# Patient Record
Sex: Male | Born: 1955 | Smoking: Former smoker
Health system: Southern US, Academic
[De-identification: ages and names within clinical notes are randomized; demographics above are authoritative.]

---

## 2018-01-08 ENCOUNTER — Encounter (INDEPENDENT_AMBULATORY_CARE_PROVIDER_SITE_OTHER): Payer: Self-pay | Admitting: Nephrology

## 2018-01-08 NOTE — Progress Notes (Signed)
New pt referral.  Voice message left with appt date/time & location.  Directions provided.  A.George, RN

## 2018-02-22 ENCOUNTER — Ambulatory Visit (INDEPENDENT_AMBULATORY_CARE_PROVIDER_SITE_OTHER): Payer: Self-pay | Admitting: Nephrology

## 2018-02-22 ENCOUNTER — Encounter (INDEPENDENT_AMBULATORY_CARE_PROVIDER_SITE_OTHER): Payer: Self-pay

## 2018-02-22 DIAGNOSIS — N183 Chronic kidney disease, stage 3 unspecified (CMS HCC): Secondary | ICD-10-CM

## 2018-02-22 DIAGNOSIS — E1121 Type 2 diabetes mellitus with diabetic nephropathy: Secondary | ICD-10-CM

## 2018-02-22 DIAGNOSIS — R030 Elevated blood-pressure reading, without diagnosis of hypertension: Secondary | ICD-10-CM

## 2018-02-22 DIAGNOSIS — E119 Type 2 diabetes mellitus without complications: Secondary | ICD-10-CM | POA: Insufficient documentation

## 2018-02-22 NOTE — Progress Notes (Signed)
ID:  62 year old man.  His primary care provider is Kevin SaaAshton Humphrey, NP-C    Chief complaint:  Chronic kidney disease    HPI:  The patient is a 62 year old man who presents for the evaluation of chronic kidney disease.  Review of records sent by the patient's primary care provider included office note dated 01/04/2018 and 12/31/2017.  Laboratory testing from 01/01/2018 showed a hemoglobin of 13.9 hemoglobin A1c 7.4%, sodium 140, potassium 5.2, bicarb 22, BUN 38, creatinine 1.7, albumin 4.8, calcium 9.8, microalbumin urine random 357.    Patient states that he has been a diabetic as long as he can remember.  It does not sound like he has had very good follow-up secondary to his job.  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  This has made it difficult for him to establish care.  A nephrologist in [place] told him Metformin ruined his kidneys.  He has been on several different medications for his diabetes since then.  He does not bring his list today and could not recall what he is taking currently.  He denies a history of retinopathy.  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  He was initially on gabapentin but that cause memory loss.  On Lyrica and Cymbalta  his neuropathy resolved.  He denied hypertension.  He states his home pressure on Friday was 118 systolic.  He denies a history of coronary artery disease or peripheral vascular disease.  He has not had any frequent urinary tract infection or kidney stones.  He denies a history of NSAID use.    Past medical history:  1. Diabetes mellitus  2.  Hypertension  3. Hyperlipidemia    Past surgical history:  1. Repair of left femur fracture    Current Outpatient Medications   Medication Sig   . losartan (COZAAR) 25 mg Oral Tablet Take 25 mg by mouth Once a day   . semaglutide (OZEMPIC) 1 mg/dose (2 mg/1.5 mL) Subcutaneous Pen Injector 1 mg by Subcutaneous route Every 7 days   . UNKNOWN MEDICATION (UNKNOWN MEDICATION)     Patient did not bring  medication list today and could not recall the medications he is currently taking.  He will bring this at to his next visit.    Social history.  Patient is a former smoker.  Patient works as a Conservation officer, [employer] worker.  He is frequently asked to move for work.    Family history:  Positive for leukemia in his mother.  No history of CKD.    Review of systems:  + voiding complaints including frequency and nocturia. Otherwise a 10+ review of systems negative except per HPI.    Physical exam:  BP 140/90   Pulse 74   Wt 103.6 kg (228 lb 6.4 oz)   General: Very pleasant, in no distress.   Eyes: No scleral icterus. EOMI  ENT: Oral mucosa moist, no lesions, no fetor.   Neck:  Supple; no JVD  Heart: Regular rate and rhythm, no murmurs, rubs or gallops.  Lungs: Clear to auscultation bilaterally with good respiratory effort. No wheezes or crackles  Abdomen: Bowel sounds present, soft, nontender.   Extremities: No pitting lower extremity edema. Warm to touch.  Neurologic: No asterixis, no tremor.      Assessment:  1. 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  Patient has been seen by Nephrology in the past.  Reports his GFR has been 30-40.  2.  Diabetes mellitus.  Hemoglobin A1c recently at 7.4%.  History of neuropathy and positive micro albuminuria.  3. Elevated blood pressure.  Patient denies a history of hypertension, however evidence of elevated blood pressure here today as well as at previous primary care visit.    Recommendations:  1. I reviewed with the patient the findings of the testing, discussed his previous nephrology evaluations and discussed risk factors for chronic kidney disease and chronic kidney disease progression.  Unfortunately, given the nature of the patient's occupation, he has had difficulty maintaining continuity of care for his chronic diseases, placing him at risk for progression.  2.  Patient was agreeable to undergoing a little bit more workup.  He denied ever having a  kidney ultrasound.  Will assess size and parenchyma of the kidneys.  We will also look at the bladder given voiding complaints.  3.Patient related that he is going to have a set of blood work for his primary care provider in the coming weeks.  I did give him a slip to assess all CKD issues.  We will also plan on quantitating urine protein.  4.  I scheduled the patient back for 4 weeks to review the plan of care of his chronic kidney disease.  He will notify us if he has to leave the area prior to our 4 week follow-up.  We will try to gather the information that we have obtained so he can take it with him to his next home.    Maryjane HurterBethany S Alaijah Gibler, MD  02/22/2018, 19:59

## 2018-02-22 NOTE — Progress Notes (Signed)
[facility] on Wednesday, August 28th @ 3:00.  Pt aware of appt date/time & location.  Order faxed.  A.George, RN

## 2018-03-22 ENCOUNTER — Encounter (INDEPENDENT_AMBULATORY_CARE_PROVIDER_SITE_OTHER): Payer: Self-pay

## 2022-02-02 IMAGING — CR XR HIP 2 OR 3 VW LEFT
1 series · 2 of 2 positions shown · non-contrast
Comparison: none

no trauma, pain in left hip
FINAL REPORT:
HISTORY: Left hip pain.
Left hip.

[AP · right · 2 of 2 slices shown]
[im 1/2]
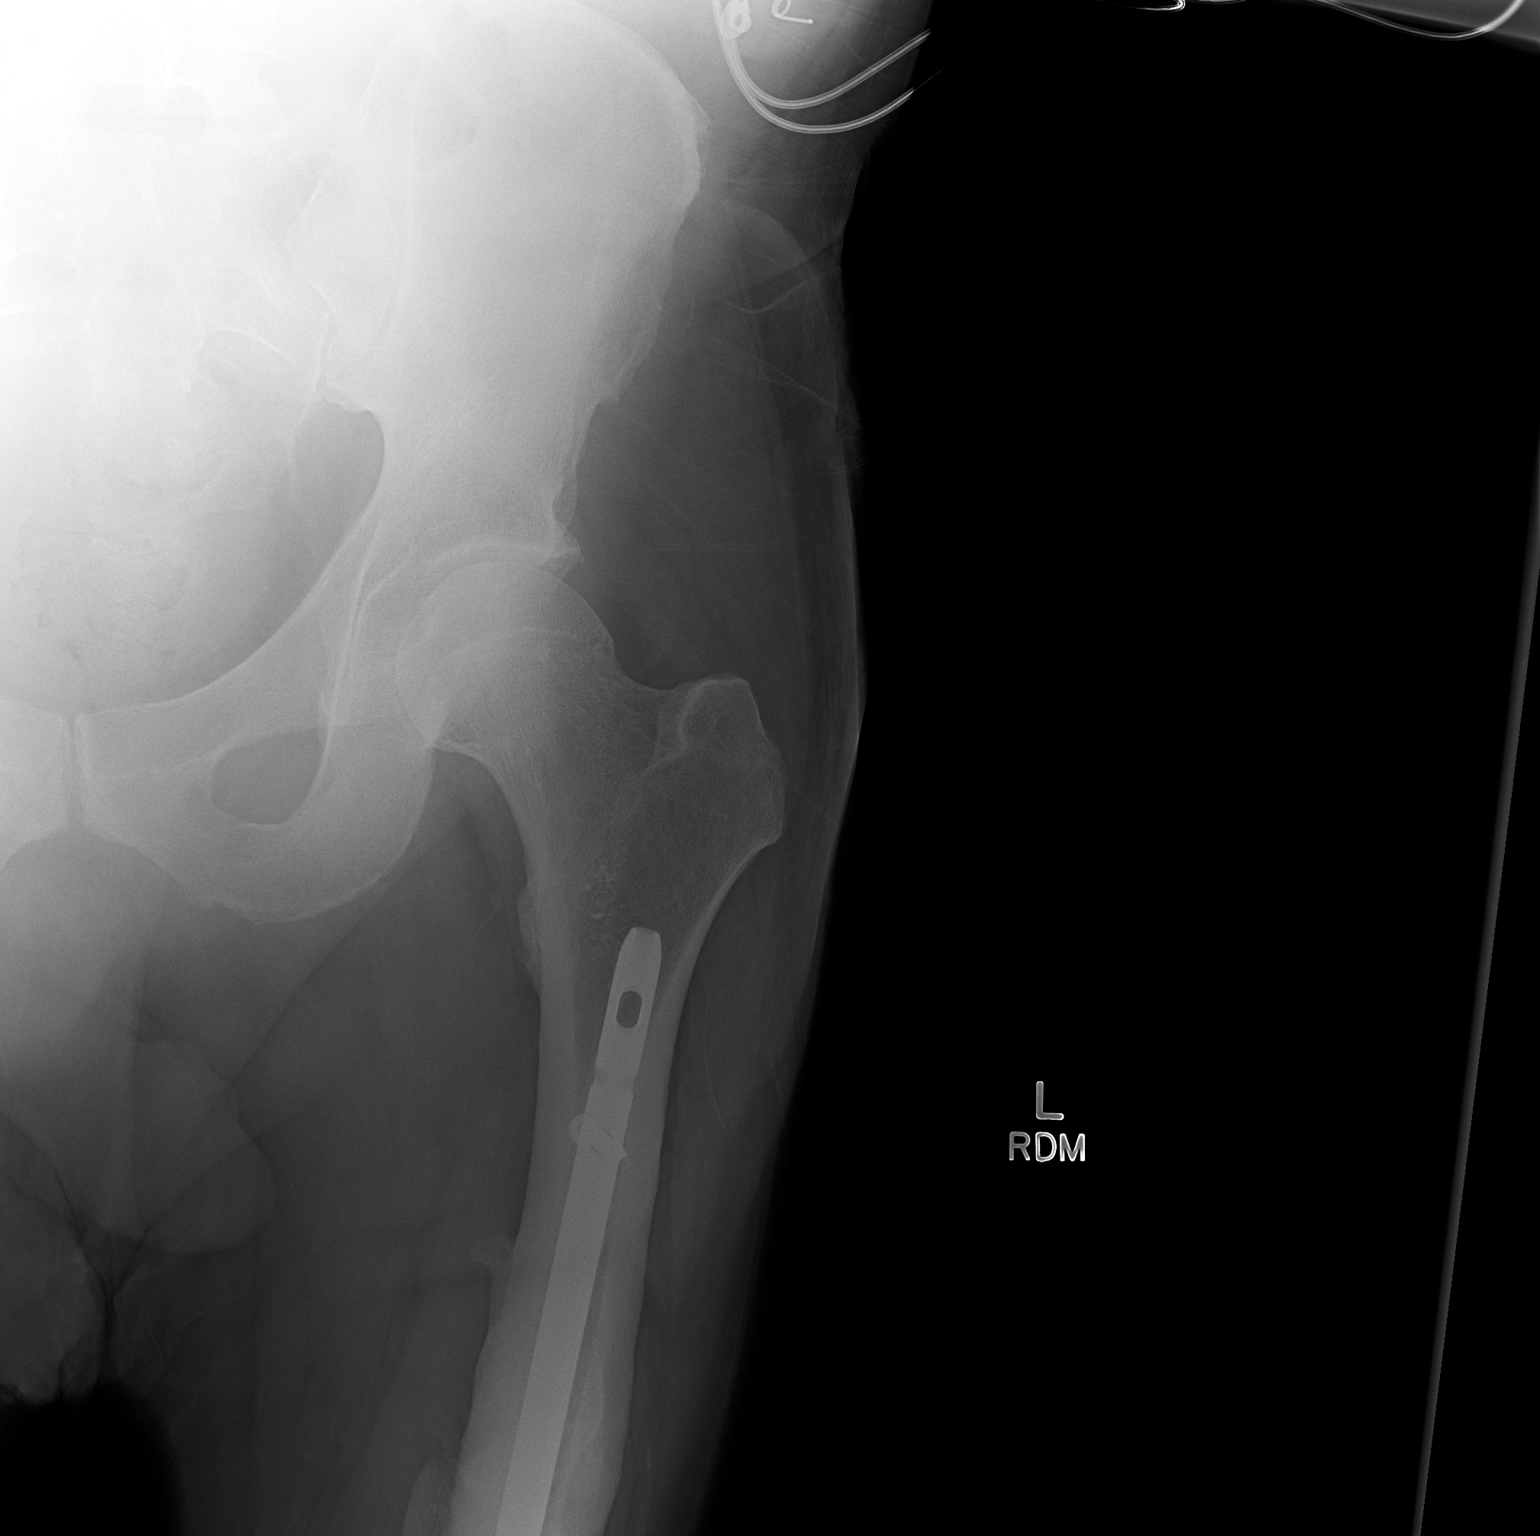
[im 2/2]
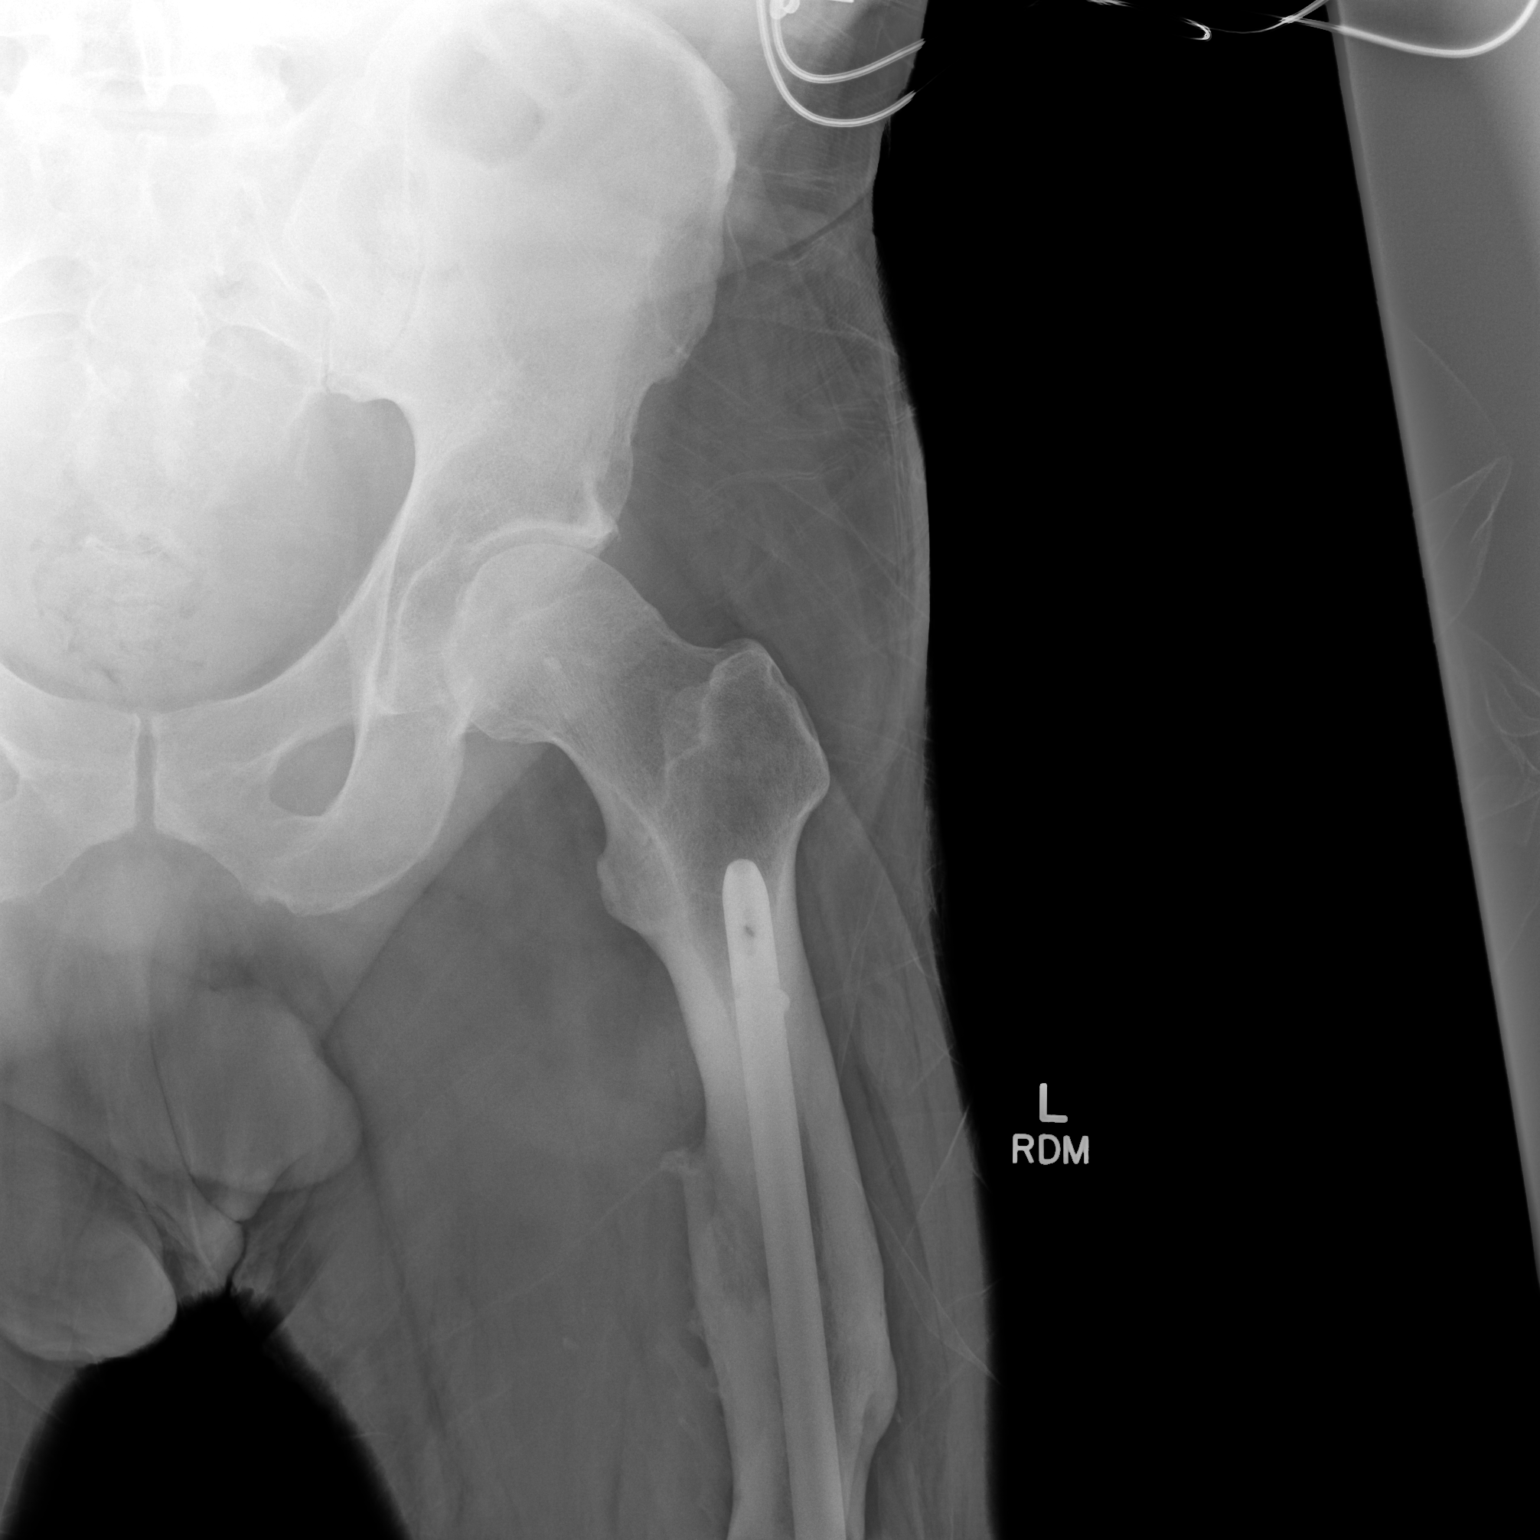

[2 of 2 positions shown; findings below may reference images not displayed]

FINDINGS: 2 views provided. Previous IM rod fixation for healed proximal left femoral shaft fracture. The hip is well located. No fracture or dislocation. Mineralization is preserved.
IMPRESSION: 1. Previous healed proximal left femoral shaft fracture with intramedullary rod fixation.
2. No bony abnormality left hip.

## 2022-02-05 IMAGING — US US LOW EXT VEINS BILAT
1 series · 14 of 16 positions shown · non-contrast
Comparison: None

ADDENDUM:
Verbally communicated to Dr. Deven on the phone by physician representative on behalf of Dr. Klever at [DATE] on February 05, 2022.
FINAL REPORT:
INDICATION: Lower extremity swelling and pain

[Series 1: us low ext veins bilat · 14 of 54 slices shown]
[im 1/54]
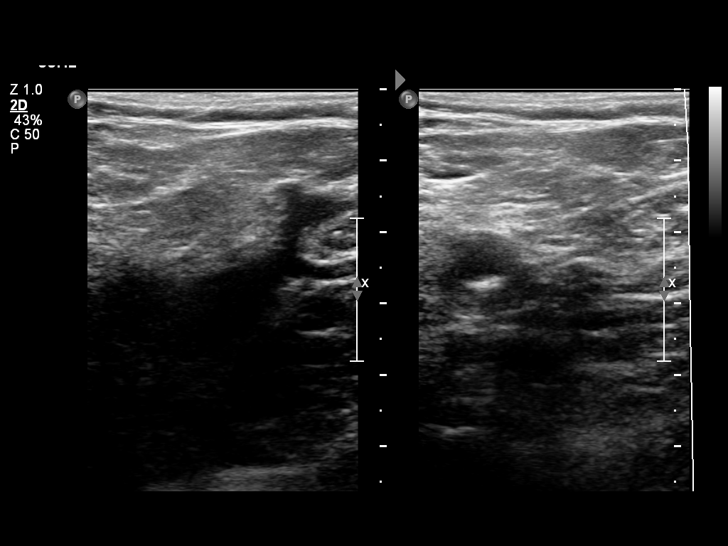
[im 4/54]
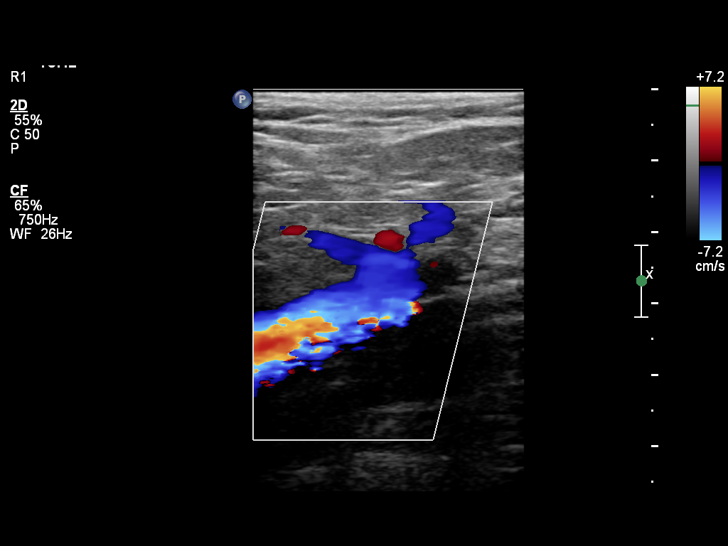
[im 8/54]
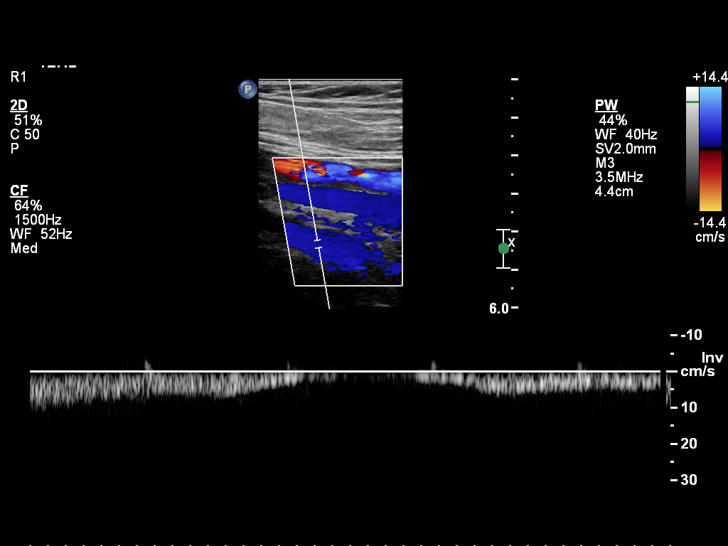
[im 15/54]
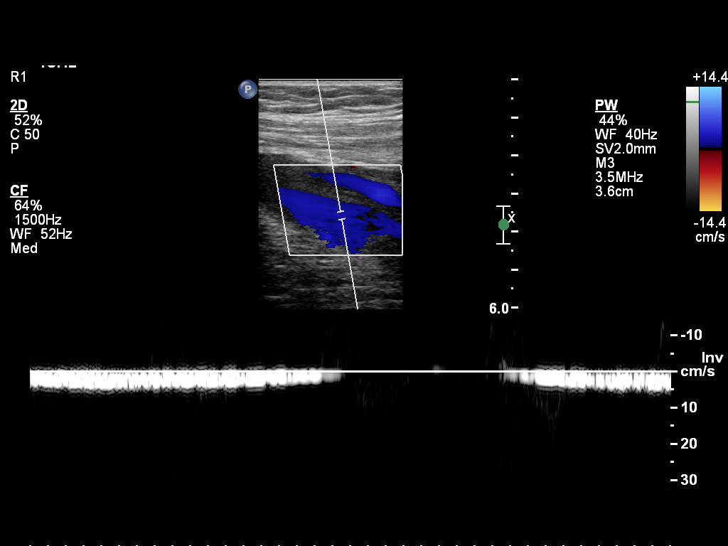
[im 18/54]
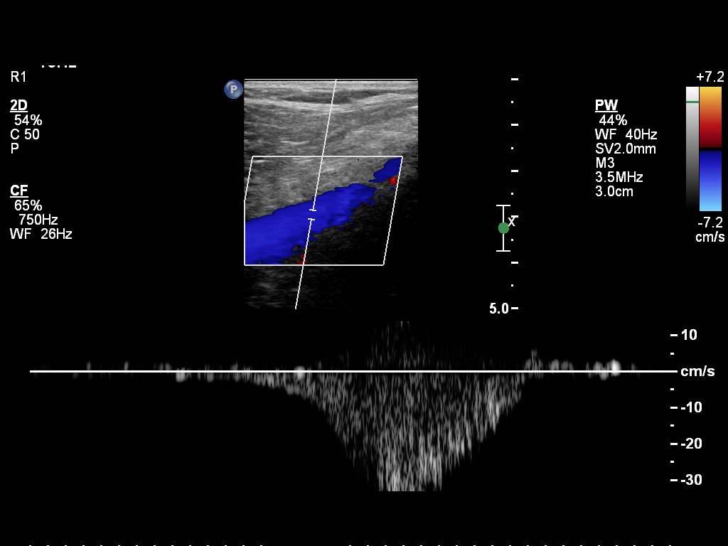
[im 22/54]
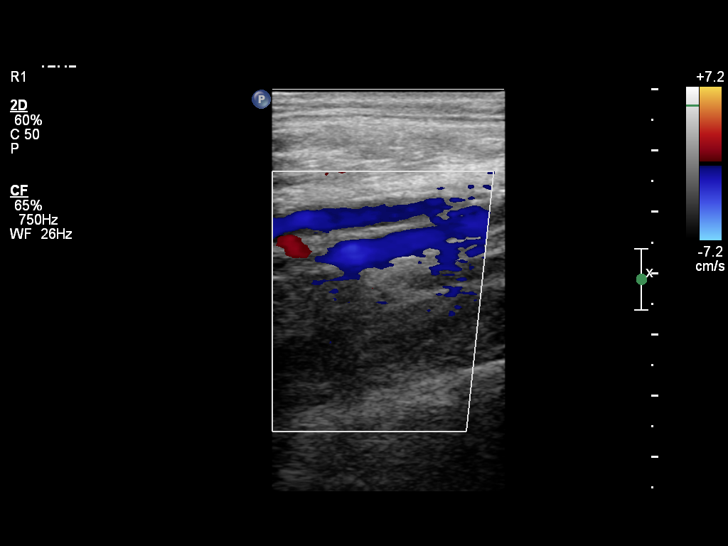
[im 25/54]
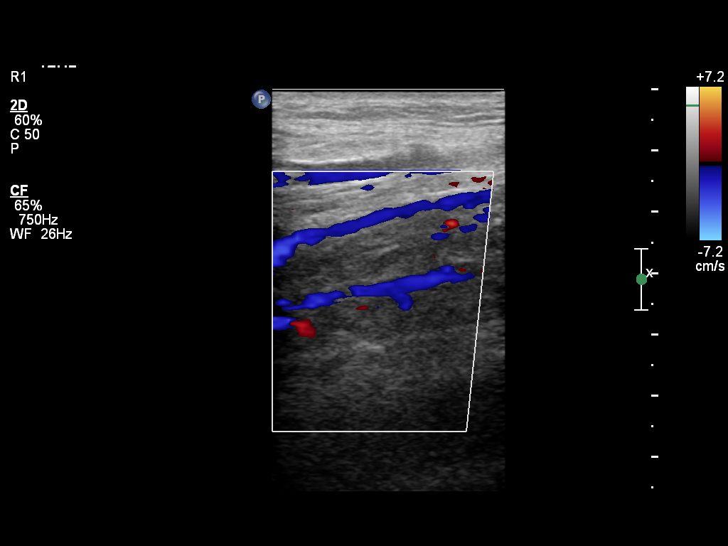
[im 29/54]
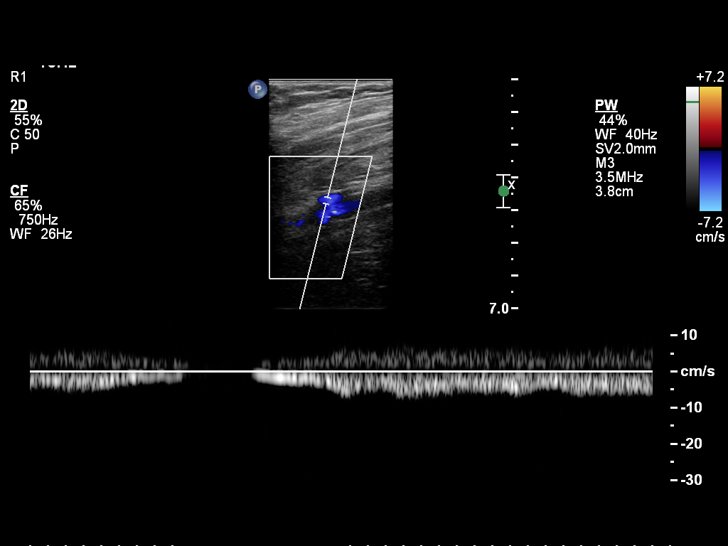
[im 32/54]
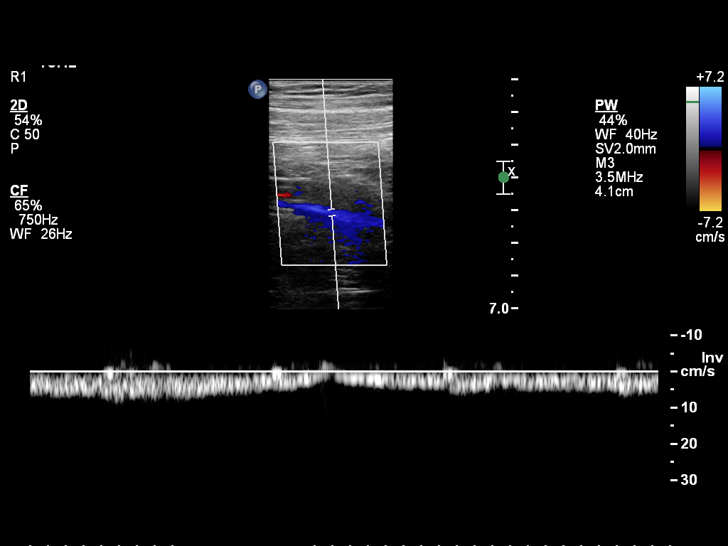
[im 36/54]
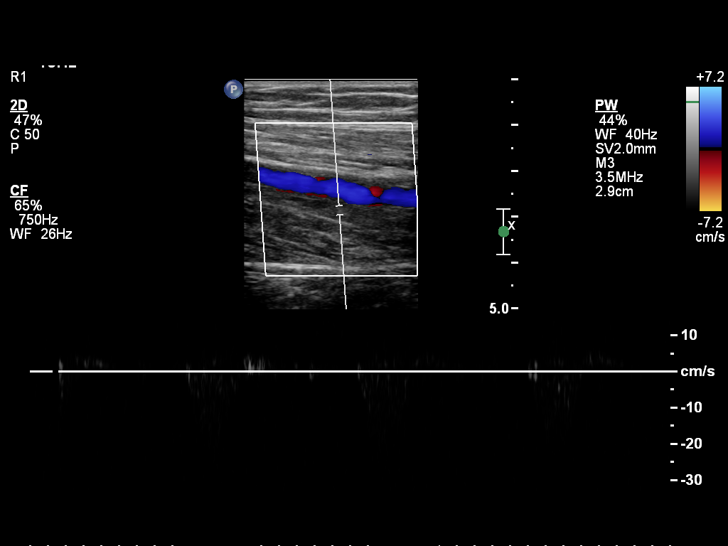
[im 43/54]
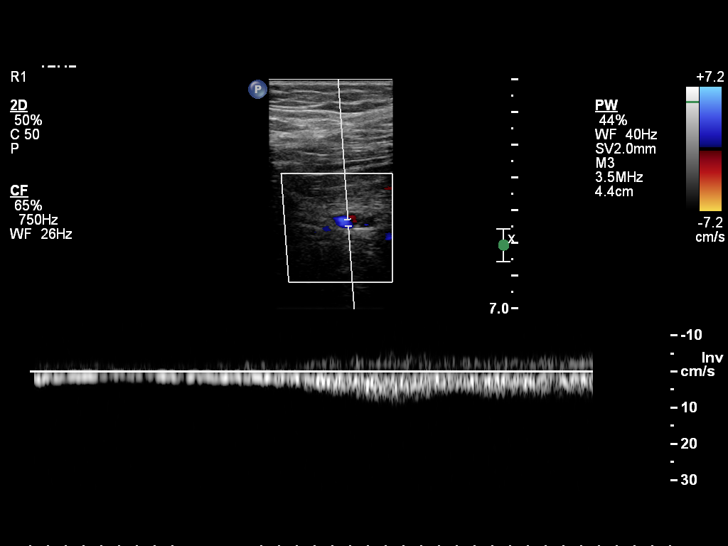
[im 46/54]
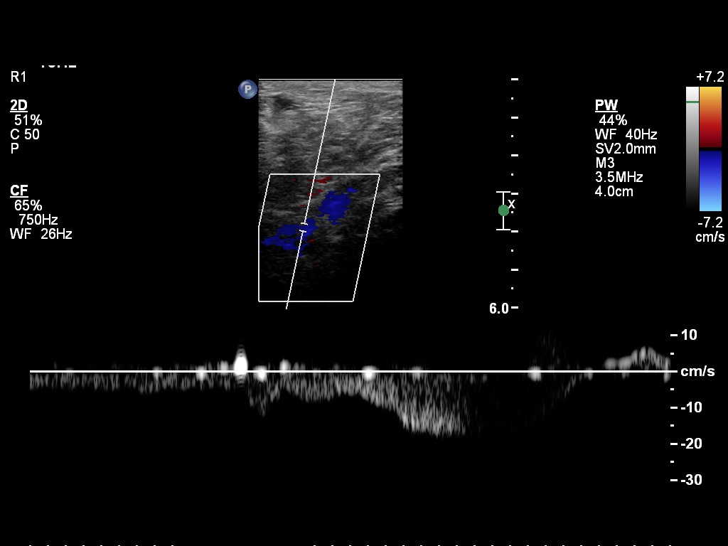
[im 50/54]
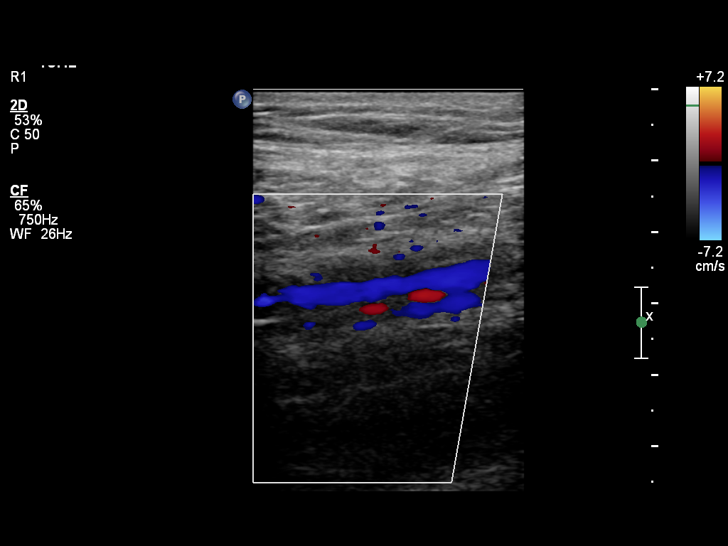
[im 54/54]
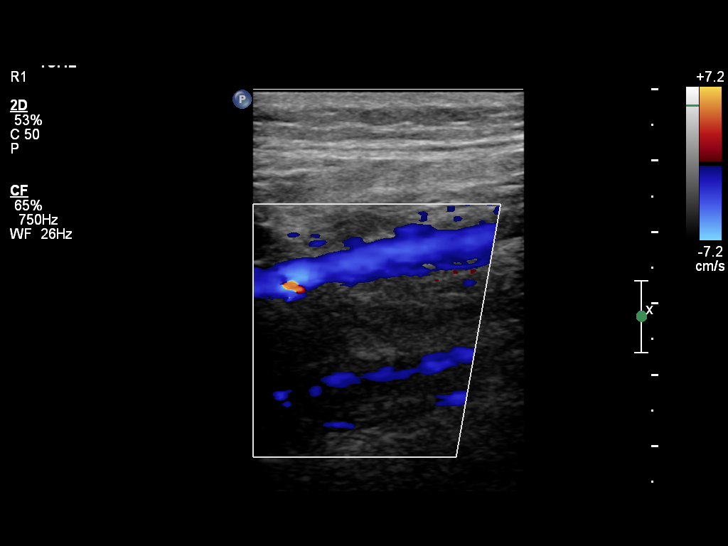

[14 of 16 positions shown; findings below may reference images not displayed]

FINDINGS: Real-time, color-flow, and duplex imaging of the deep veins of bilateral lower extremity was performed. Specifically, imaging of the common femoral, sapheno-femoral junction, profunda femoral, femoral, and popliteal veins was performed.
Left lower extremity: There is thrombus in the proximal, mid, and distal portions of the left femoral vein. Popliteal, posterior tibial, peroneal veins are compressible.
Right lower extremity: All the deep veins, as well as the sapheno-femoral junction, were compressible with normal color blood flow and normal phasicity of flow at rest with normal response to augmentation.
IMPRESSION: Thrombosis of the proximal, mid, and distal portions of the femoral vein in the left lower extremity , compatible with deep venous thrombosis.

## 2022-03-10 IMAGING — MR MRI BRAIN WITHOUT CONTRAST
10 of 12 series · 37 of 48 positions shown · non-contrast
Comparison: CT of the head 03/10/2022.

FINAL REPORT:
MRI of the brain without contrast material
INDICATION: Mental status change. Stroke follow-up. Abnormal CT of the head.
TECHNIQUE: Noncontrast multiplanar multisequence imaging of the brain was performed.

[Series 2: survey_mpr_sag · oblique · 1.6mm · 1.60mm/px · 1 of 5 slices shown]
[im 1/5]
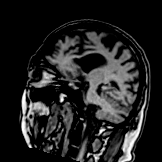

[Series 3: survey_mpr_cor · coronal · 1.6mm · 1.60mm/px · 1 of 3 slices shown]
[im 1/3]
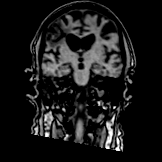

[Series 4: survey_mpr_(person_name) · axial · 1.6mm · 1.60mm/px · 1 of 3 slices shown]
[im 1/3]
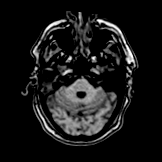

[Series 5: T1 · sagittal · 5.0mm · 0.75mm/px · 4 of 25 slices shown (1 of 2)]
[im 1/25]
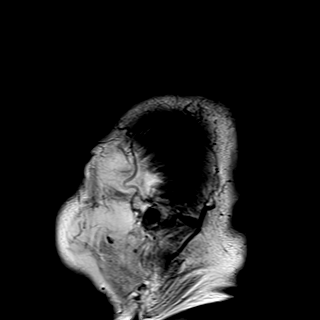
[im 9/25]
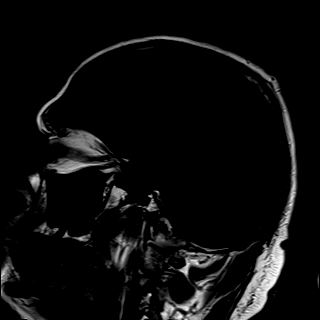
[im 17/25]
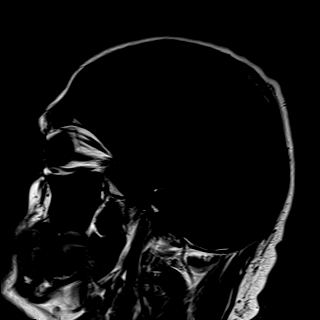
[im 25/25]
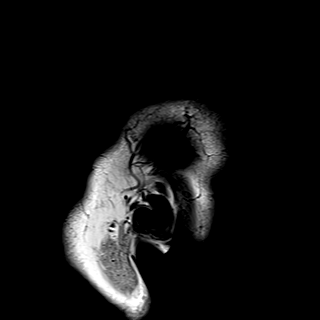

[Series 6: ax dwi_tracew · axial · 5.0mm · 0.60mm/px · z∈[-74,+37]mm · 4 of 27 slices shown]
[im 1/27]
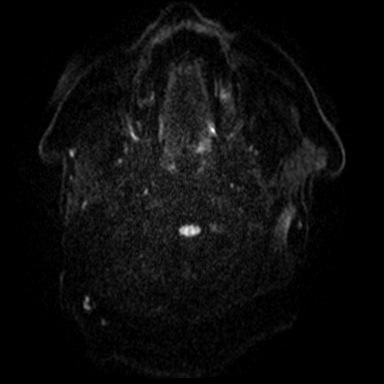
[im 7/27]
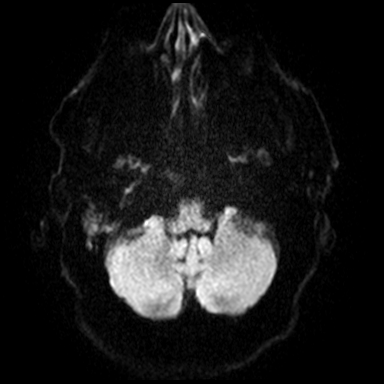
[im 14/27]
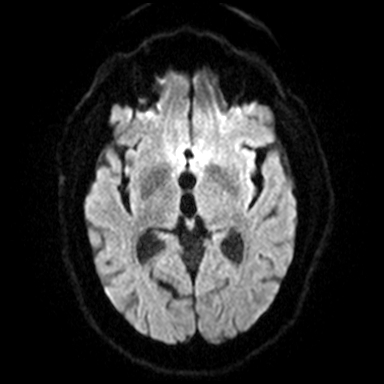
[im 20/27]
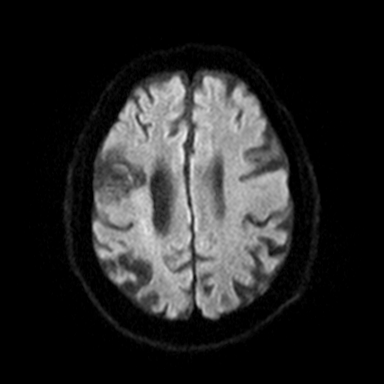

[Series 9: FLAIR · axial · 5.0mm · 0.72mm/px · z∈[-71,+81]mm · 5 of 27 slices shown]
[im 1/27]
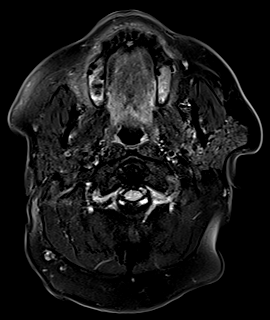
[im 7/27]
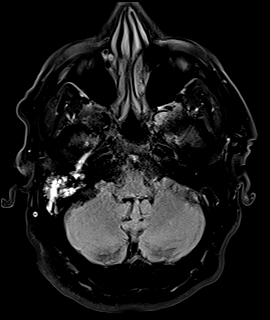
[im 14/27]
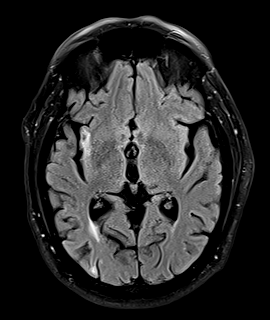
[im 20/27]
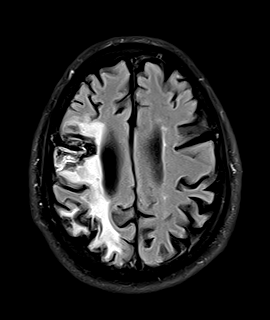
[im 27/27]
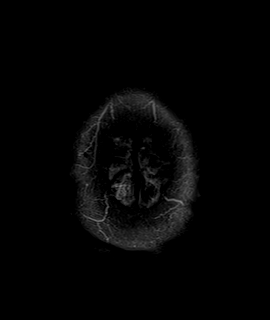

[Series 10: T2 fat-sat · axial · 5.0mm · 0.51mm/px · z∈[-71,+81]mm · 5 of 27 slices shown (1 of 2)]
[im 1/27]
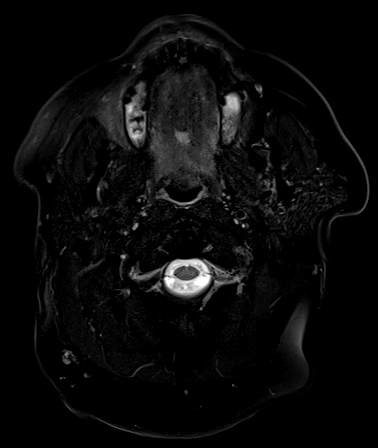
[im 7/27]
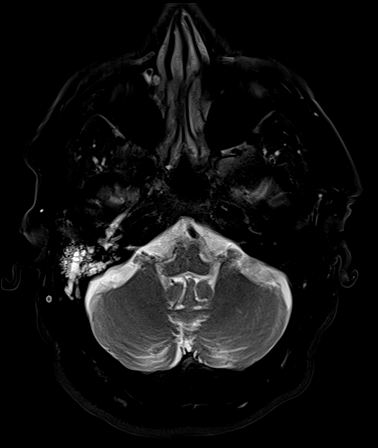
[im 14/27]
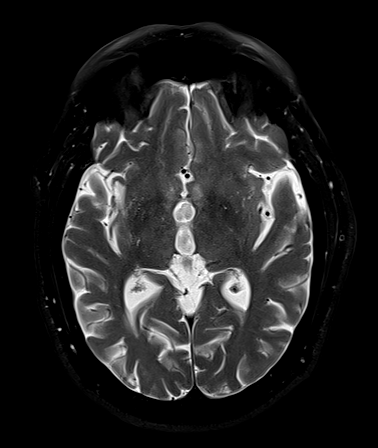
[im 20/27]
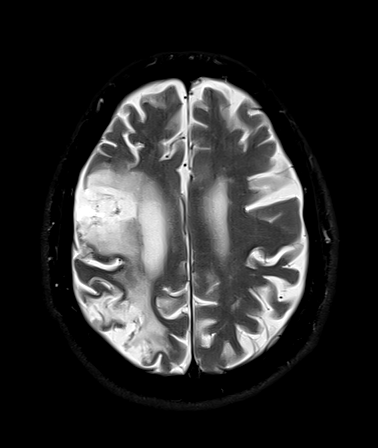
[im 27/27]
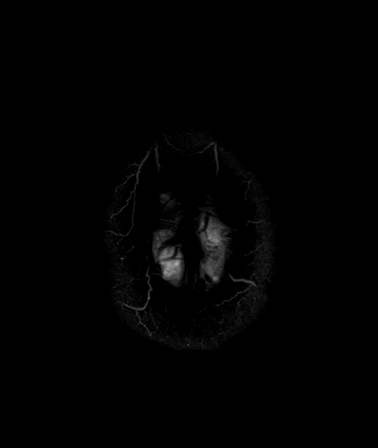

[Series 11: GRE · axial · 5.0mm · 0.45mm/px · z∈[-71,+81]mm · 5 of 27 slices shown]
[im 1/27]
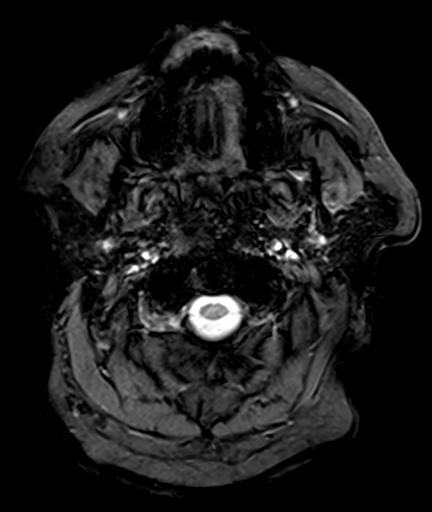
[im 7/27]
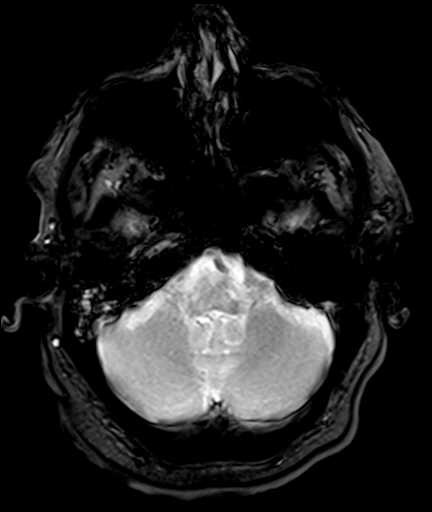
[im 14/27]
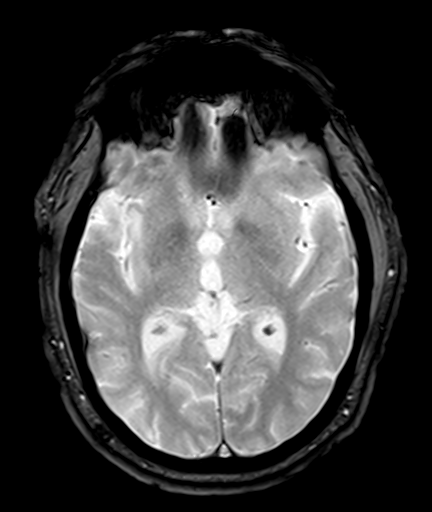
[im 20/27]
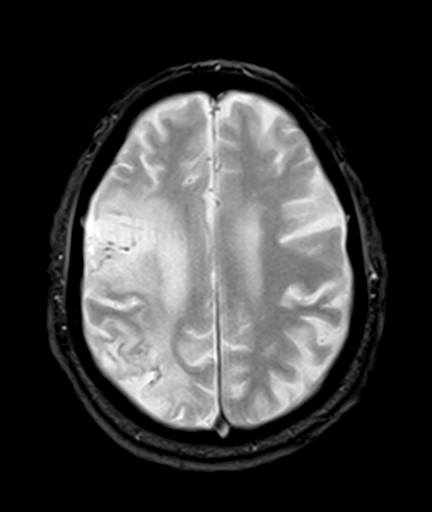
[im 27/27]
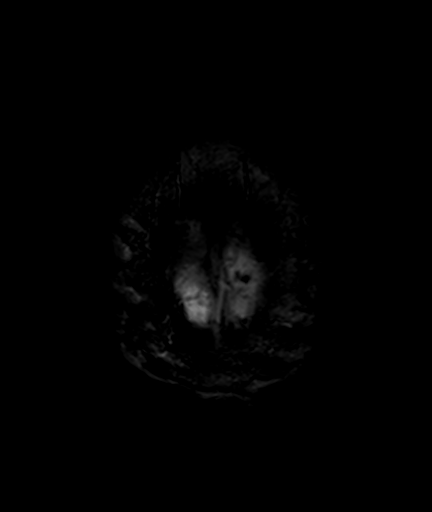

[Series 12: T1 · axial · 5.0mm · 0.72mm/px · z∈[-71,+81]mm · 5 of 27 slices shown (2 of 2)]
[im 1/27]
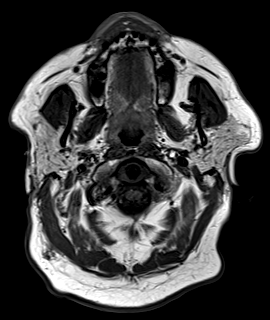
[im 7/27]
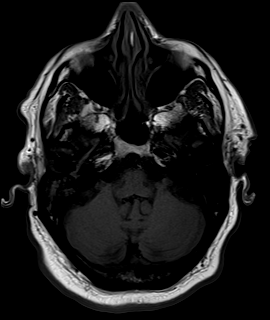
[im 14/27]
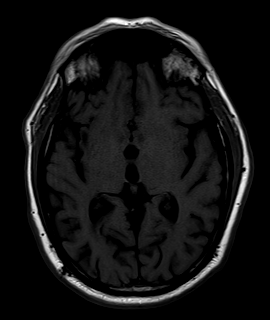
[im 20/27]
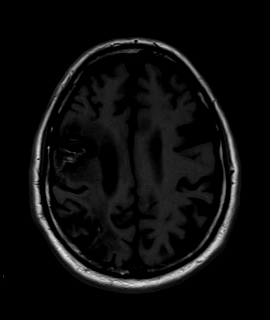
[im 27/27]
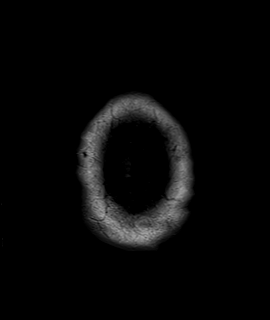

[Series 13: T2 fat-sat · coronal · 2.0mm · 0.56mm/px · 6 of 35 slices shown (2 of 2)]
[im 1/35]
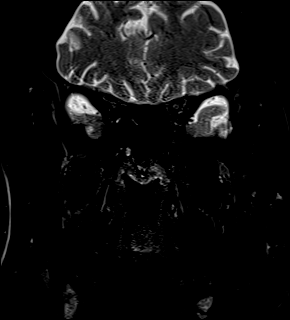
[im 7/35]
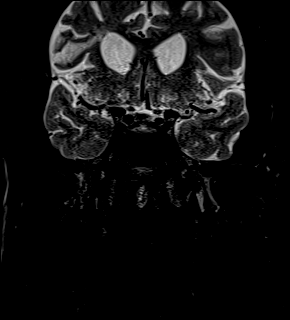
[im 14/35]
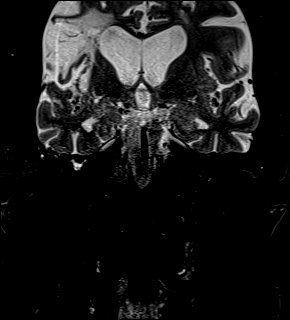
[im 21/35]
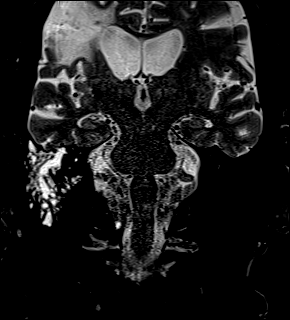
[im 28/35]
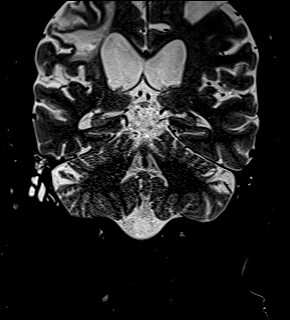
[im 35/35]
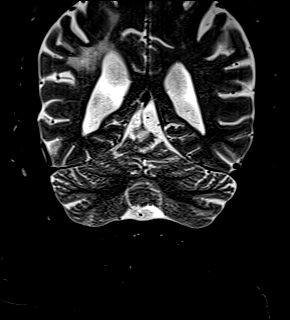

[37 of 48 positions shown; findings below may reference images not displayed]

FINDINGS: Along the high posterior lateral right frontal lobe, there is a transcortical infarction measuring 2.3 x 2.8 cm, with developing cystic encephalomalacia, and gyriform intrinsic T1 hyperintensity. This is most in keeping with an infarction in the late subacute to chronic phase, with laminar necrosis an petechial hemorrhage. Giving the laminar necrosis, no significant restricted diffusion is noted.
There is additional extension of the infarction into the anterior insula/subinsular region, without restricted diffusion. This infarction involves the anterior/superior aspects of the right MCA distribution, and possibly a small portion of the posterior watershed distribution.
There is an additional region of transcortical infarction with encephalomalacia and gliosis involving the high right parietal lobe, measuring up to 3.1 x 5.0 cm. There is no associated restricted diffusion. This is most in keeping with an infarction, which appears chronic in nature. This may involve a portion of the posterior watershed distribution and a portion of the posterior division right MCA distribution.
No focus of restricted diffusion to suggest an acute or early subacute infarction.
There are normal flow voids in the visible larger intracranial vessels.
There is no hydrocephalus.
There is mild chronic microangiopathic change in the pons. Small chronic cortical based infarction along the high left frontal lobe (series 9 images 23 and 24) involving the anterior watershed distribution.
There is moderate generalized cerebral volume loss, with commensurate prominence of the ventricular system. Normal flow voids are identified in the visible larger intracranial vessels.
The visible orbits, pituitary gland, and upper cervical spine are unremarkable.
There is extensive fluid throughout the mastoid air cells on the right. No discrete nasopharyngeal mass is noted. There is also a small amount of fluid within the right eustachian tube. No suspicious marrow signal abnormality.
IMPRESSION: 1. Late subacute to chronic infarction involving the right frontal lobe, extending into the anterior insula along a portion of the right MCA distribution or there is mild associated laminar necrosis.
2. Chronic appearing infarction along the right parietal lobe, which may involve a portion of the posterior watershed distribution as well as a portion of the MCA distribution.
3. Small chronic infarction along the high left frontal lobe involving the anterior watershed distribution.
4. Extensive fluid in the mastoid air cells on right and right eustachian tube. Correlate clinically for mastoiditis.

## 2022-03-10 IMAGING — CT CT HEAD WITHOUT CONTRAST
2 of 3 series · 15 of 40 positions shown, 18 images · non-contrast
Comparison: None available

Confusion for 3 days; no fall
Hx stroke; no cancer; no sx
FINAL REPORT:
INDICATION: Mental status change, unknown cause Pain
All CT scans at this facility use dose modulation and/or weight based dosing when appropriate to reduce radiation dose to as low as reasonably achievable.
Exam: CT of the head without IV Contrast

[Series 2: head stnd · axial · 0.46mm/px · z∈[-24,+129]mm · 12 of 36 slices shown, 15 images]
[im 3/36  brain]
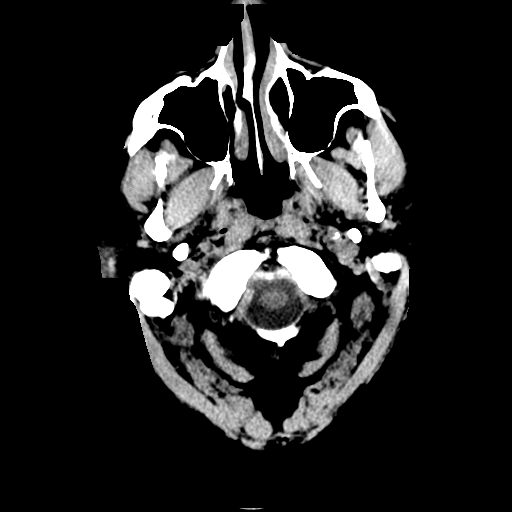
[im 3/36  bone]
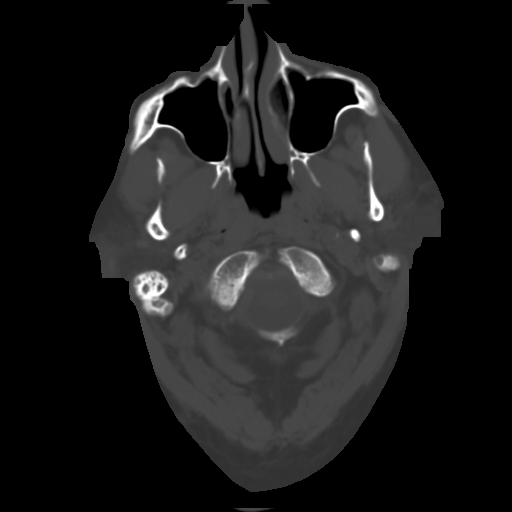
[im 5/36  brain]
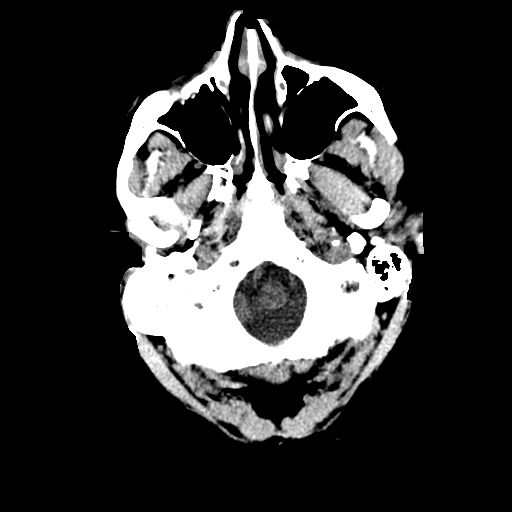
[im 8/36  brain]
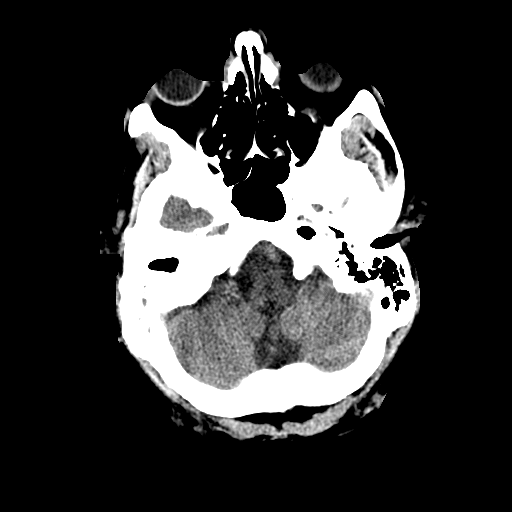
[im 11/36  brain]
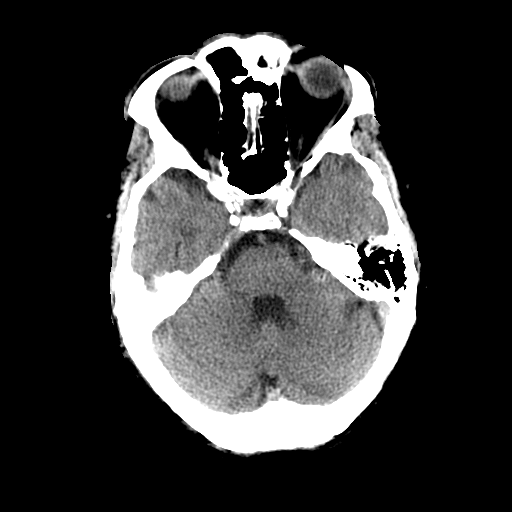
[im 14/36  brain]
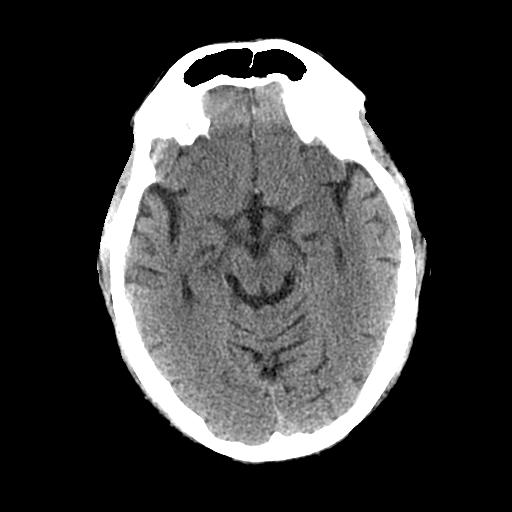
[im 14/36  bone]
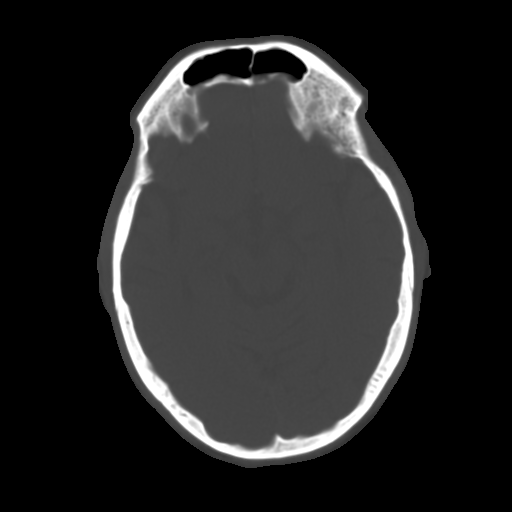
[im 16/36  brain]
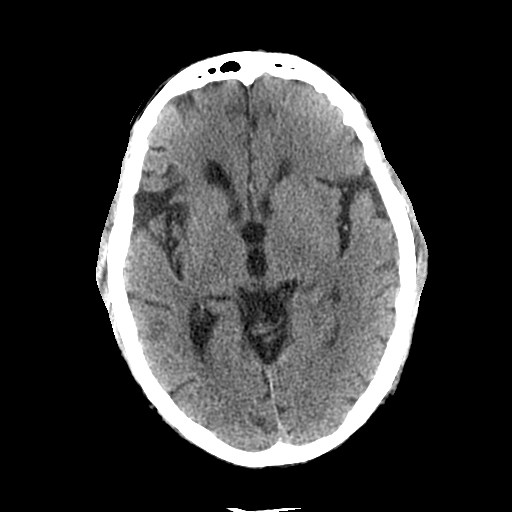
[im 20/36  brain]
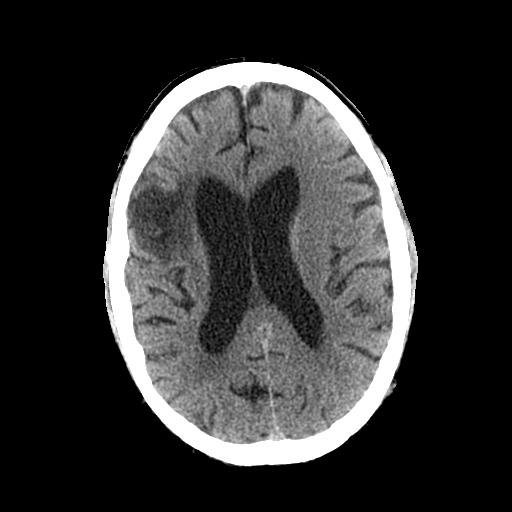
[im 22/36  brain]
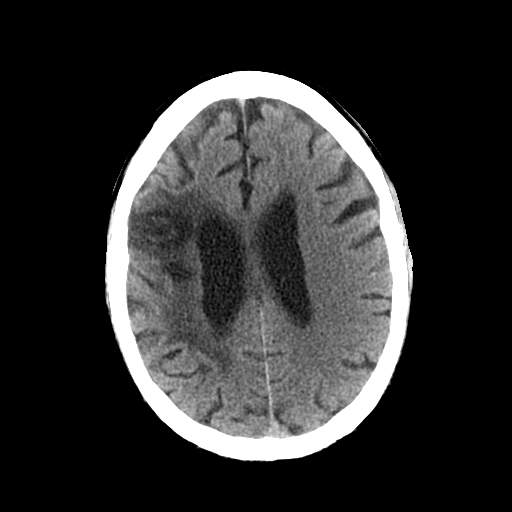
[im 25/36  brain]
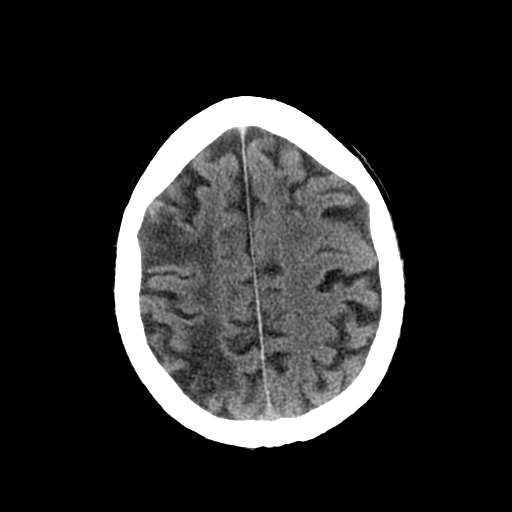
[im 25/36  bone]
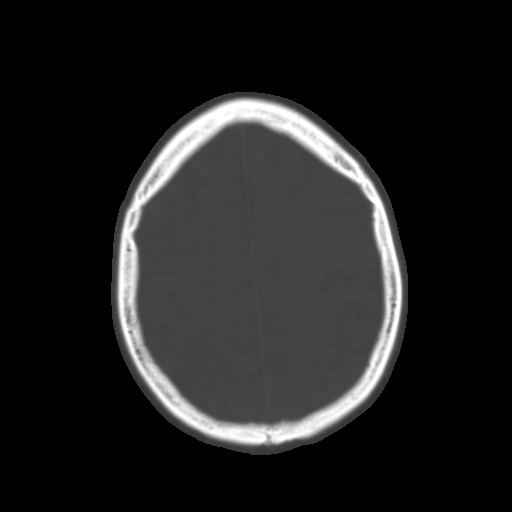
[im 28/36  brain]
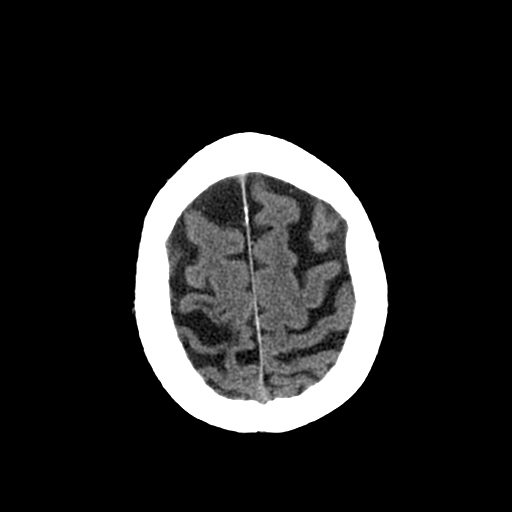
[im 31/36  brain]
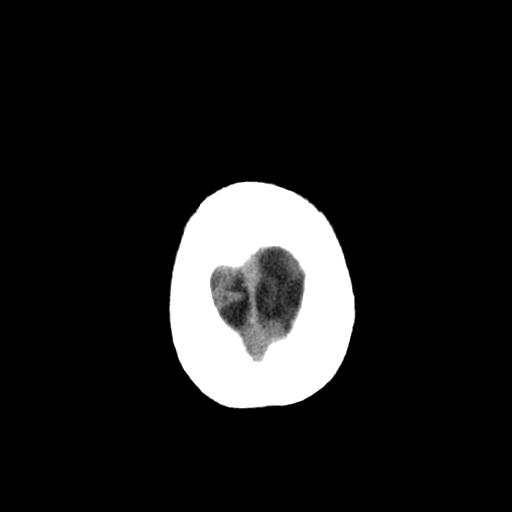
[im 33/36  brain]
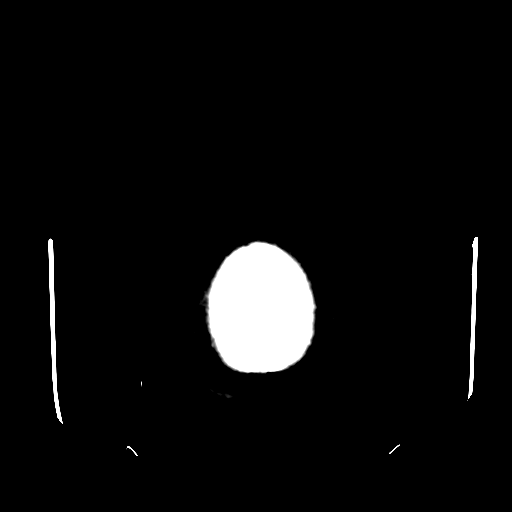

[Series 601: cor head · coronal · 0.46mm/px · 3 of 119 slices shown]
[im 24/119  brain]
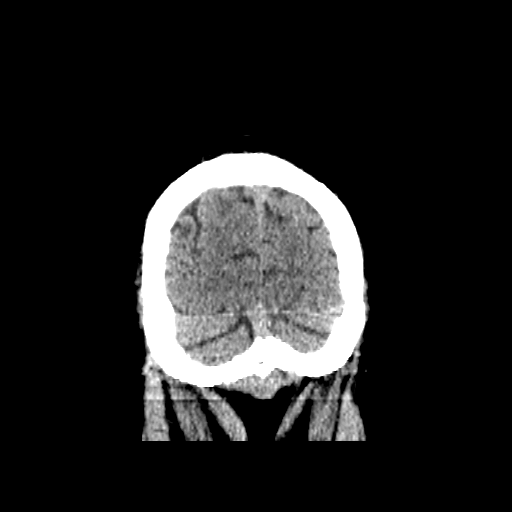
[im 48/119  brain]
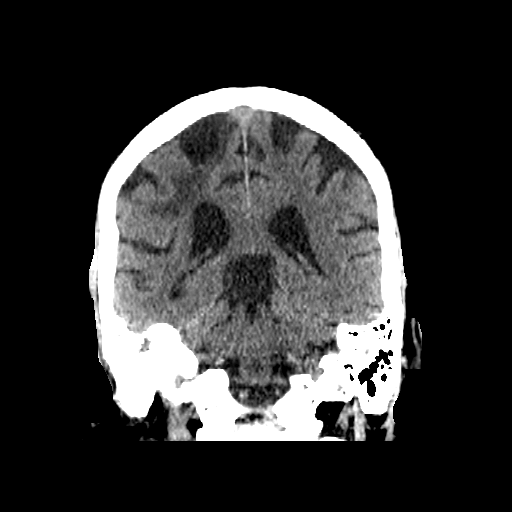
[im 71/119  brain]
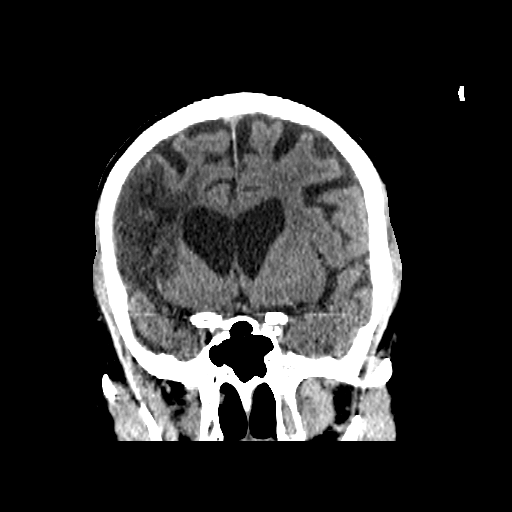

[15 of 40 positions shown; findings below may reference images not displayed]

FINDINGS: Parenchyma: There is hypodensity within the right frontal and parietal lobe with confluent decreased attenuation throughout the periventricular white matter
Ventricles: The ventricles are normal in size, shape and position.
Extra-axial spaces: There is no abnormal extra-axial fluid.
Basal cisterns: The basal cisterns are patent.
Midline shift: No midline shift.
Vascular system: Vascular structures demonstrate symmetric density.
Paranasal sinuses and mastoid air cells: Right mastoid aircell opacification.
Visualized orbits: Orbits are symmetric.
Calvarium:  Skull base and calvarium are normal.
IMPRESSION: 
IMPRESSION: Two areas of encephalomalacia in right frontal lobe like subacute stroke. If there is concern for acute stroke recommend MRI.

## 2022-03-11 IMAGING — US US CAROTID DOPPLER BILATERAL
1 series · 14 of 24 positions shown · non-contrast
Comparison: None
Grayscale images show mild atherosclerotic plaque formation.

FINAL REPORT:
Carotid duplex:
CLINICAL INDICATION: STROKE
Stroke suspected (Ped 0-18y)

[Series 1: us carotid doppler bilateral · 14 of 62 slices shown]
[im 1/62]
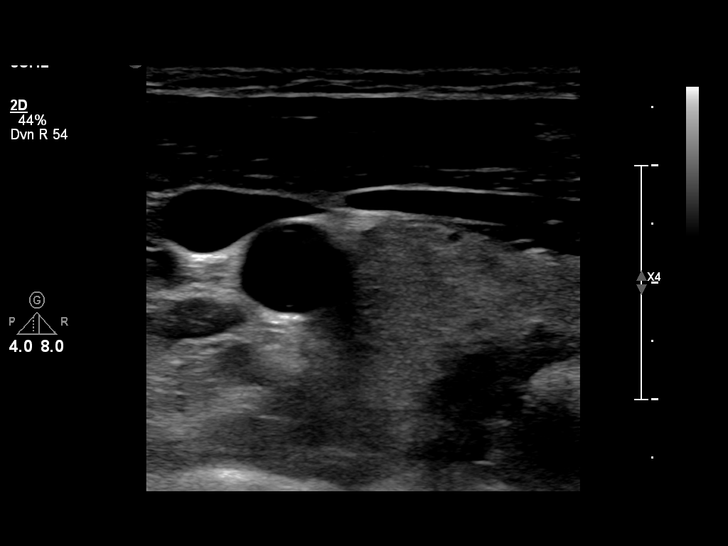
[im 6/62]
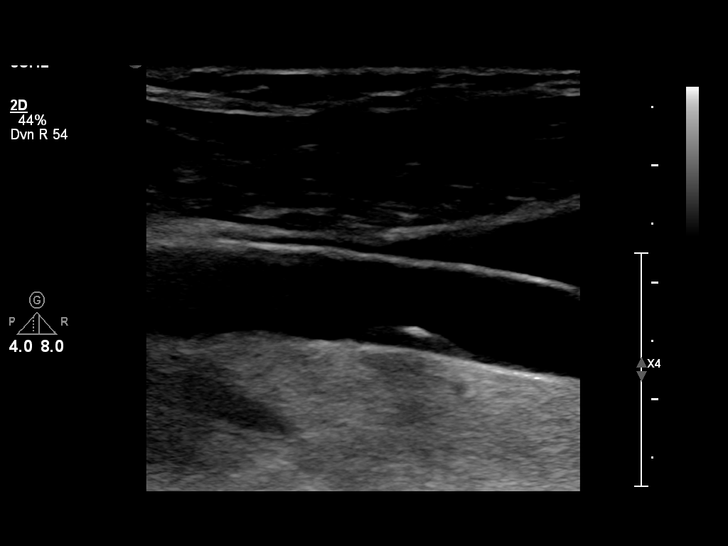
[im 11/62]
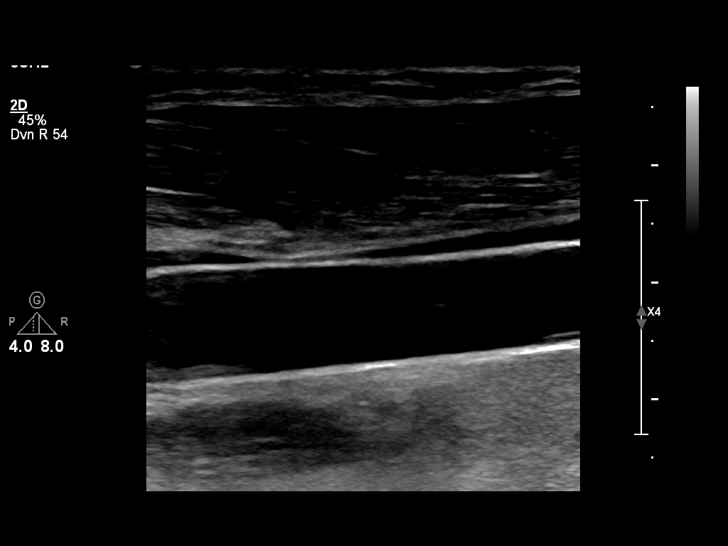
[im 16/62]
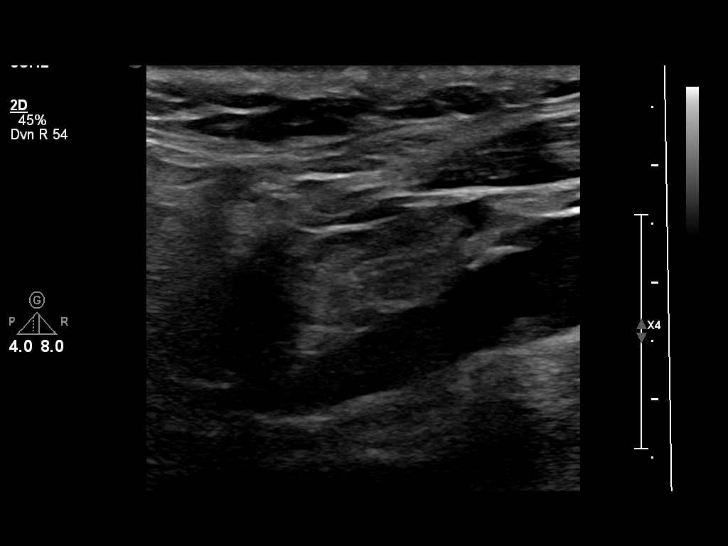
[im 19/62]
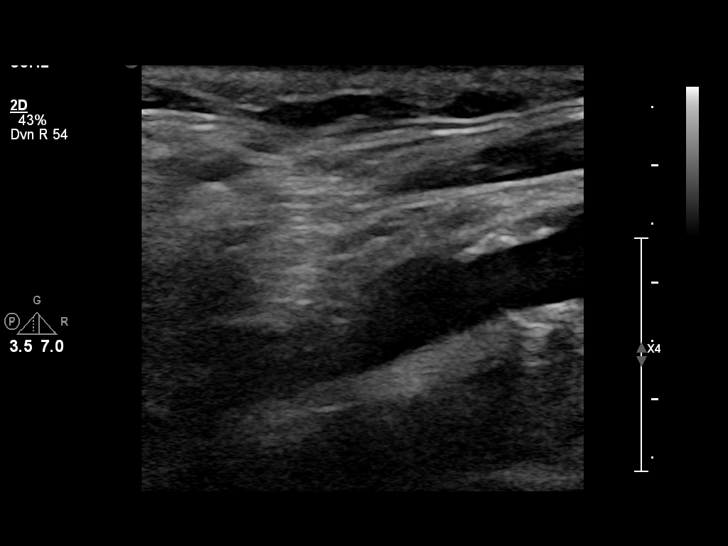
[im 24/62]
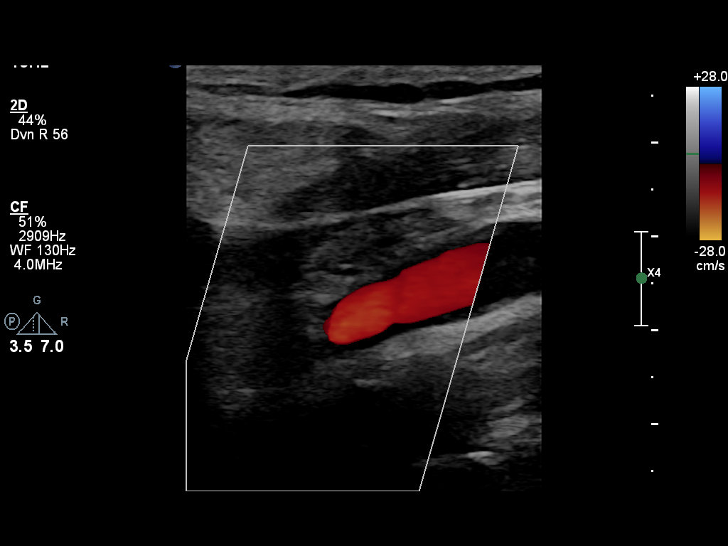
[im 30/62]
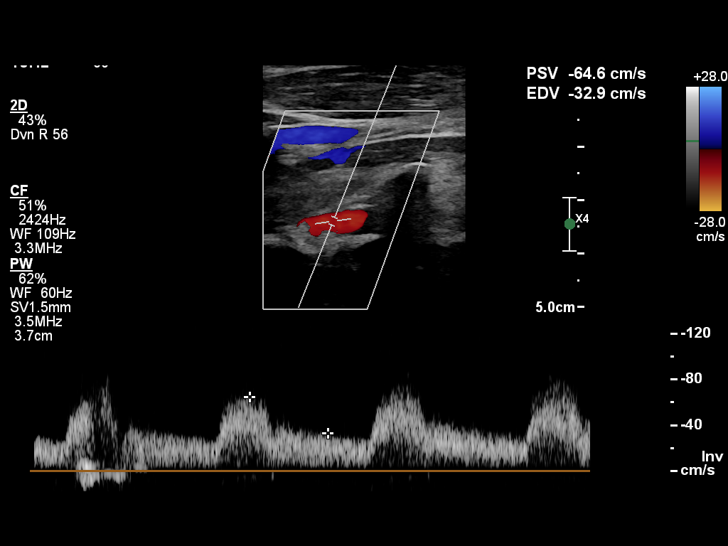
[im 32/62]
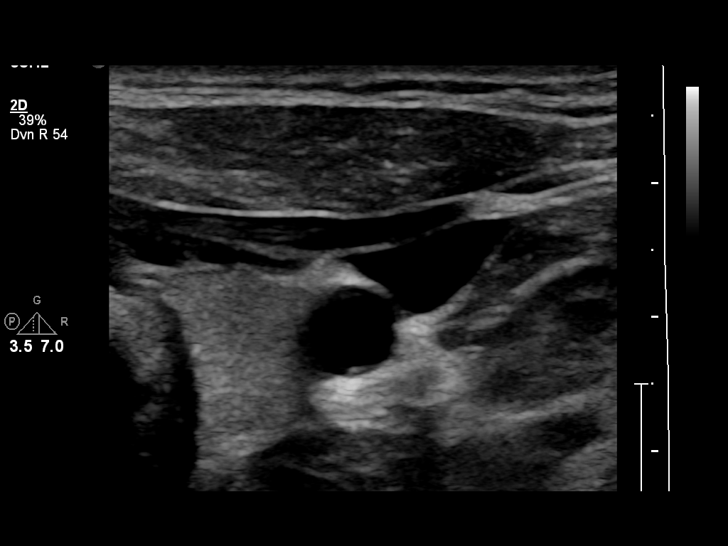
[im 38/62]
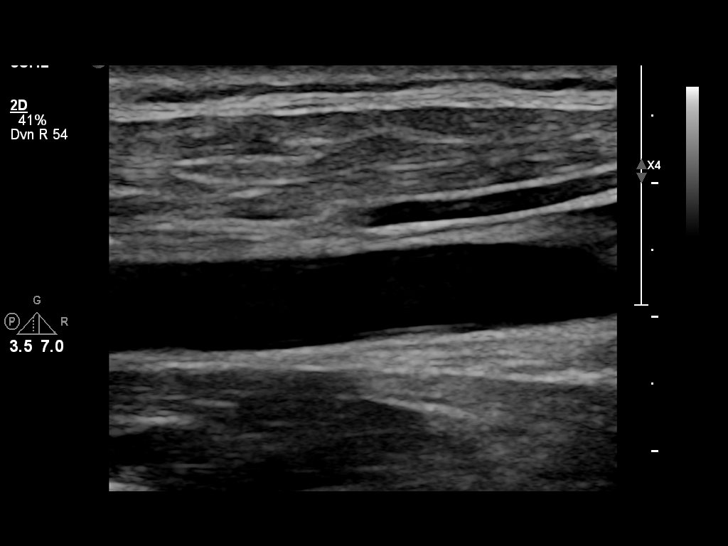
[im 43/62]
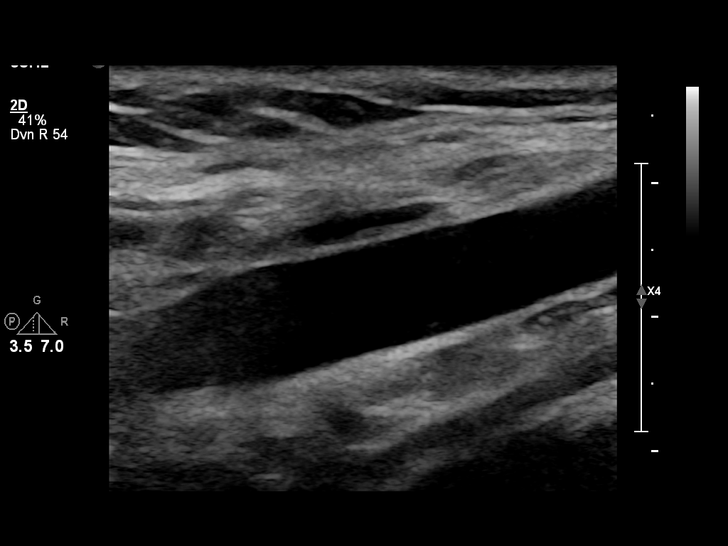
[im 48/62]
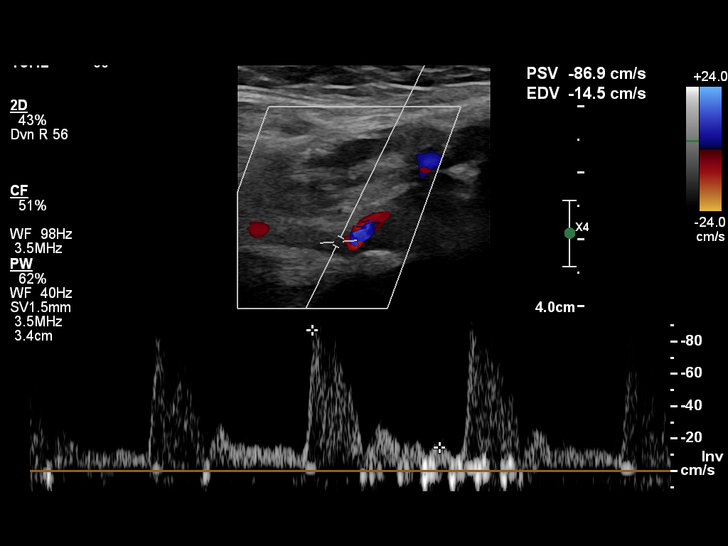
[im 51/62]
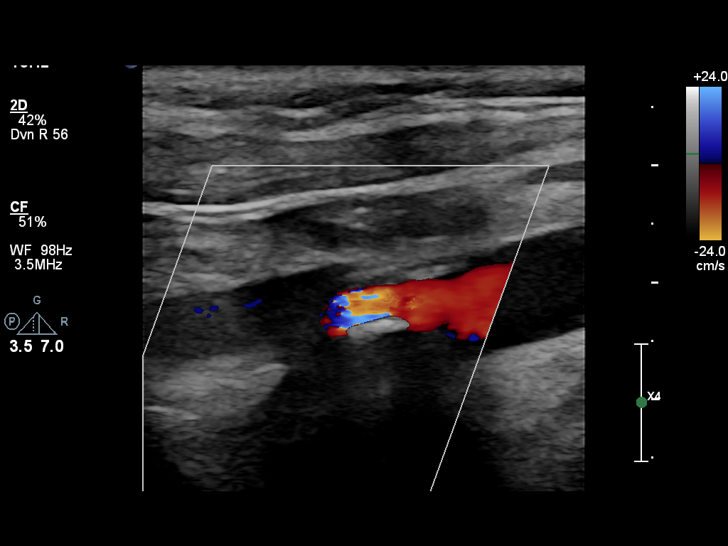
[im 56/62]
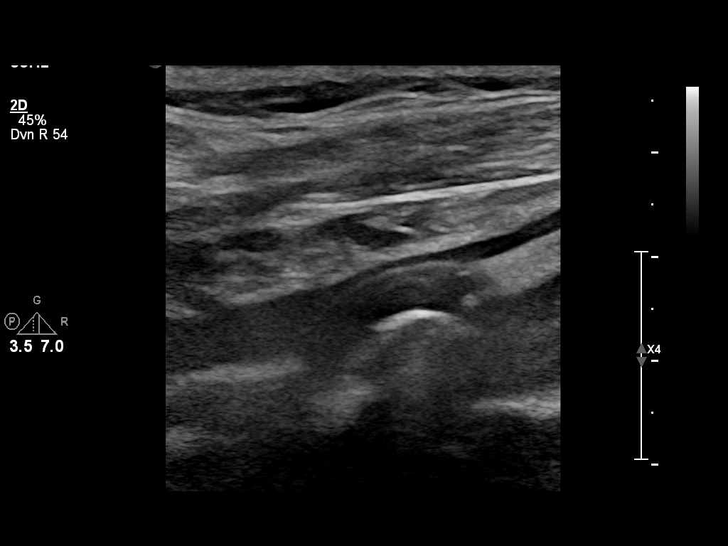
[im 62/62]
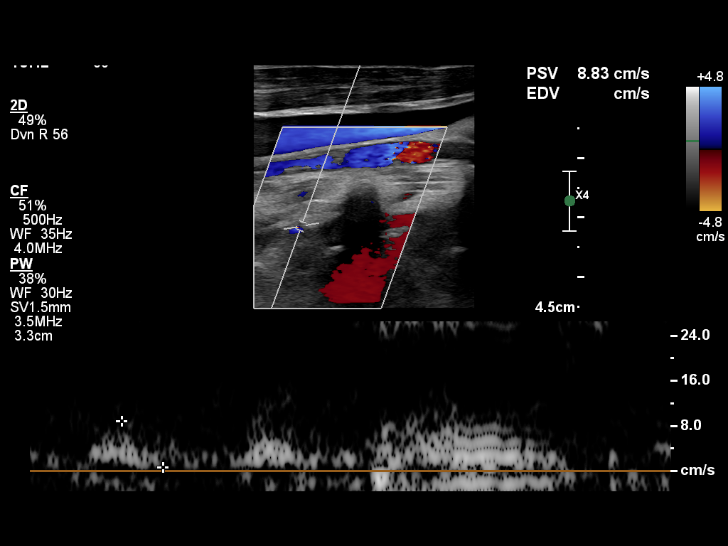

[14 of 24 positions shown; findings below may reference images not displayed]

Right internal and common carotid artery peak systolic velocities are 89 and 101 cm/sec respectively. Right vertebral artery flow is antegrade.
Left internal and common carotid artery peak systolic velocities are 161 and 140 cm/sec respectively. Left vertebral artery flow is antegrade.
Stenosis estimation is based on SRU consensus criteria.
IMPRESSION: Left ICA peak systolic velocity is in the 50-69% stenosis range. However, velocity ratio is normal. Consider correlation with CTA.

## 2022-03-12 IMAGING — DX XR HIP 2 OR 3 VW LEFT
1 series · 2 of 2 positions shown · non-contrast
Comparison: None available.

FINAL REPORT:
EXAM: XR HIP 2 OR 3 VW LEFT
INDICATION: OTHER
left hip pain

[Series 2047: AP · right · 0.10mm/px · 2 of 2 slices shown]
[im 1/2]
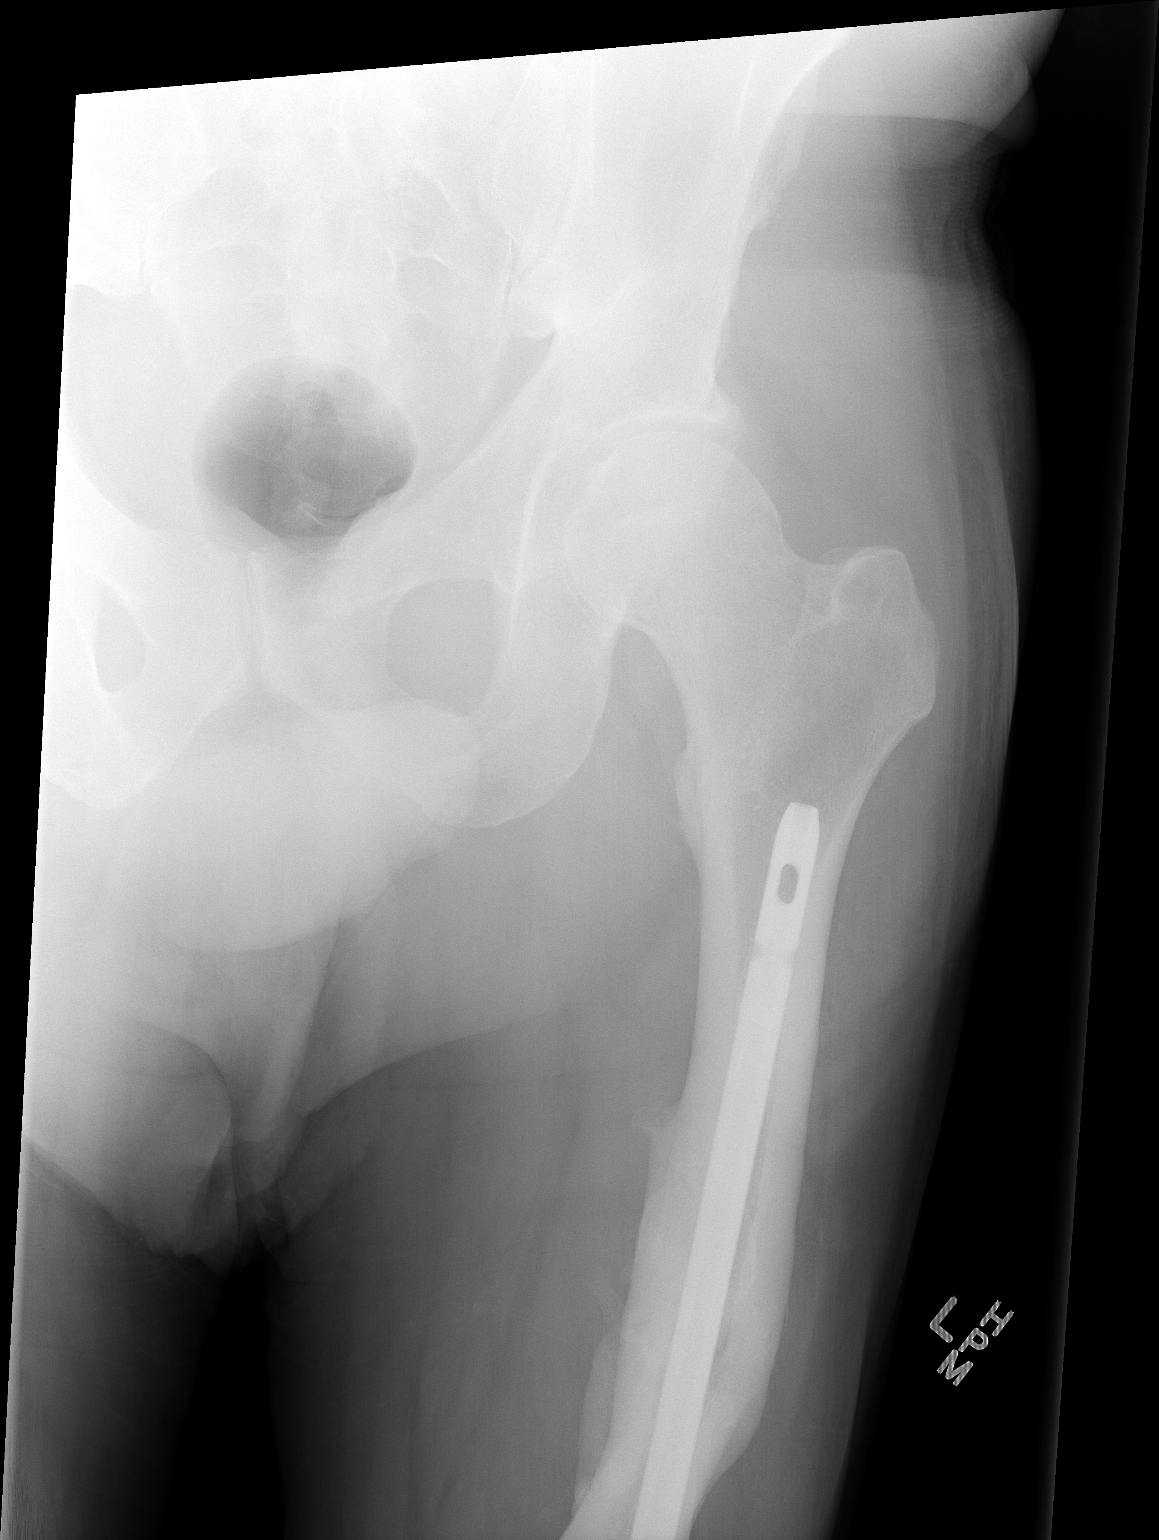
[im 2/2]
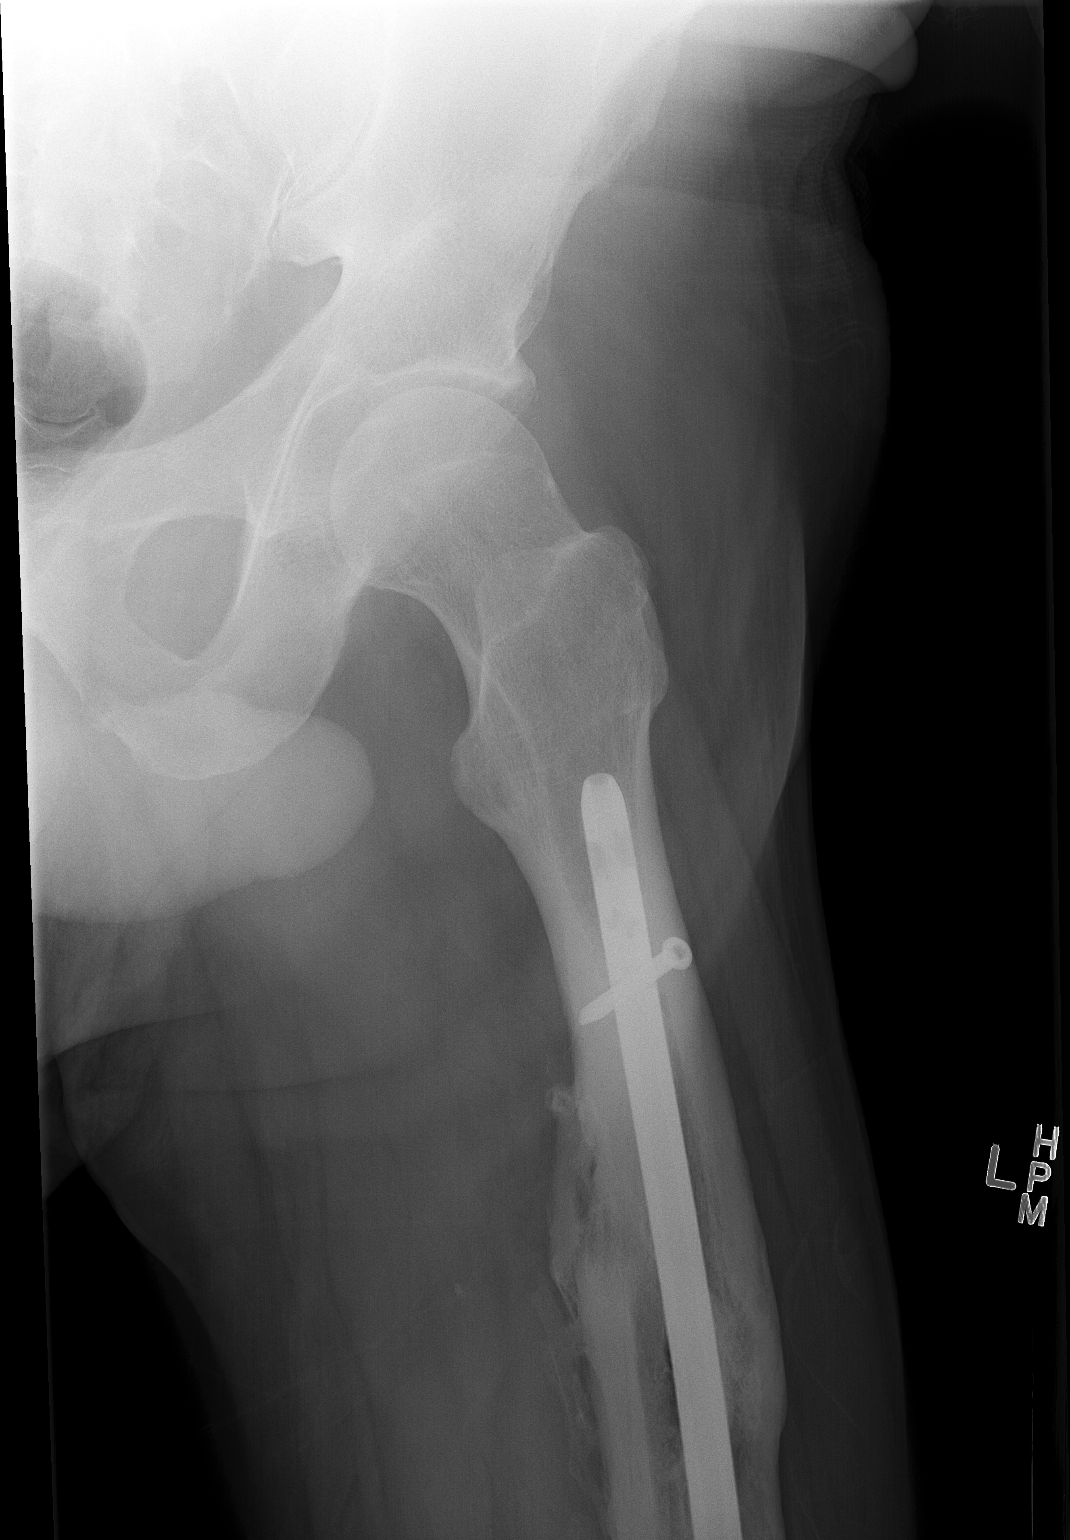

[2 of 2 positions shown; findings below may reference images not displayed]

IMPRESSION: FINDINGS/IMPRESSION:
No acute fracture or dislocation. Joint spaces are maintained.
Partially visualized intramedullary nail and screw fixation hardware within the proximal femur. No evidence of loosening or retraction about the visualized portions of the hardware.

## 2022-07-16 IMAGING — CT CT HEAD ANGIO WITH AND WITHOUT IV CONTRAST
1 of 12 series · 8 of 33 positions shown · non-contrast
Comparison: Head CT earlier same day
PROCEDURE: Standard views IV contrast for CTA protocol.

CVA, left sided weakness DEEMED EMERGENT BY DR. RONLOR
No hx of cancer or surgery, hx of stroke
FINAL REPORT:
CT HEAD ANGIO WITH AND WITHOUT IV CONTRAST
CLINICAL DATA: cva

[Series 2: cta thins · axial · 0.60mm/px · z∈[-200,+109]mm · 8 of 636 slices shown]
[im 71/636  soft-tissue]
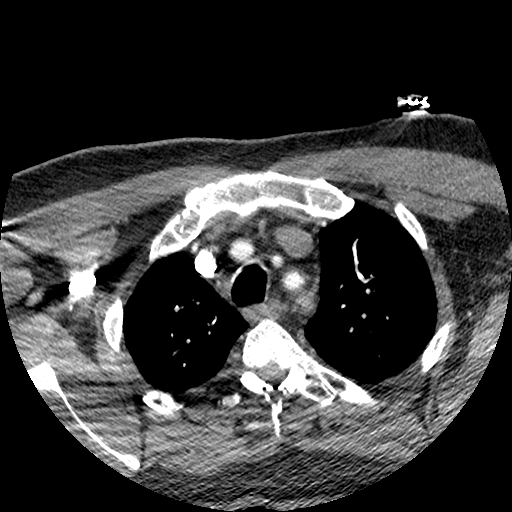
[im 142/636  bone]
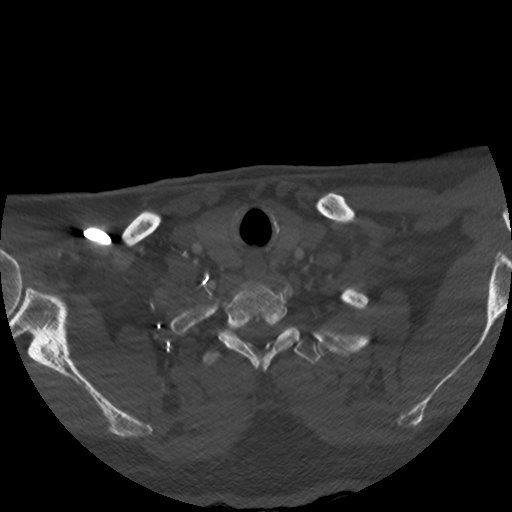
[im 212/636  soft-tissue]
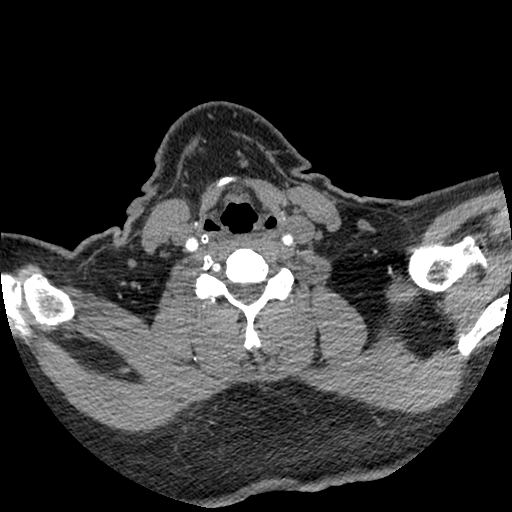
[im 283/636  bone]
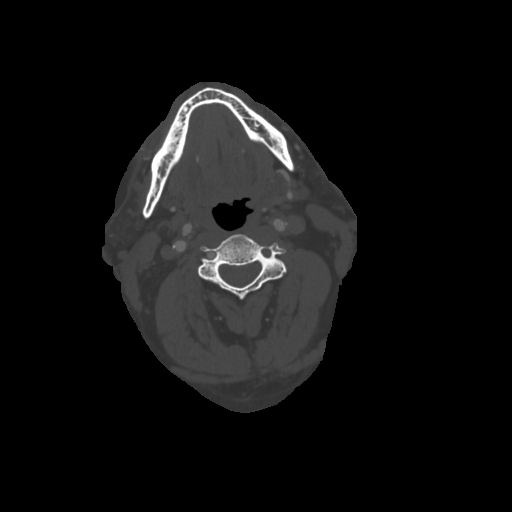
[im 353/636  soft-tissue]
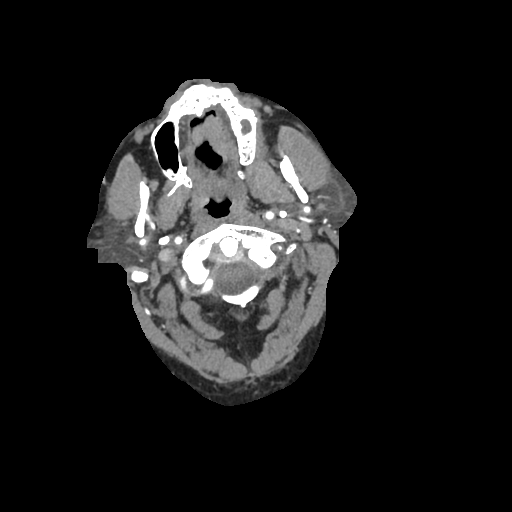
[im 424/636  bone]
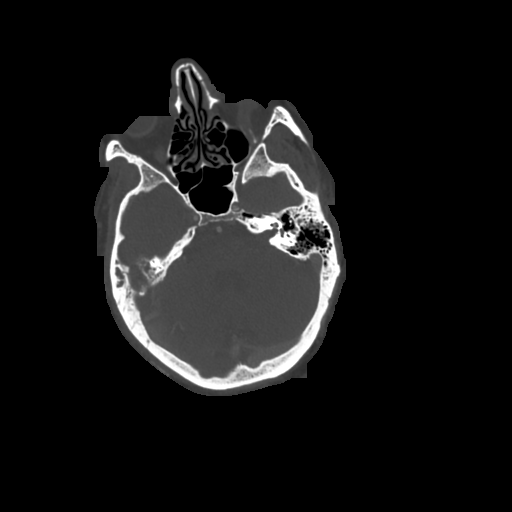
[im 494/636  soft-tissue]
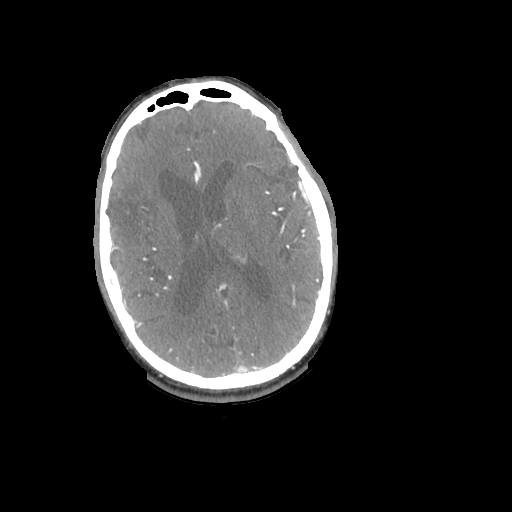
[im 565/636  bone]
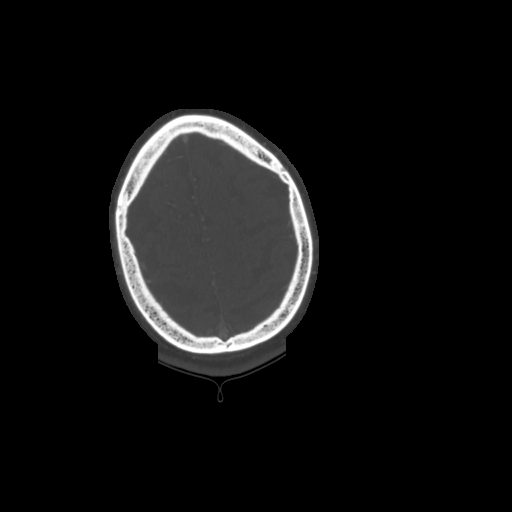

[8 of 33 positions shown; findings below may reference images not displayed]

Axial source images supplemented by 3-D reformats.
All CT scans at this facility use dose modulation and/or weight-based dosing when appropriate to reduce radiation dose to as low as reasonably achievable.
FINDINGS: Right vertebral dominant foramen magnum, predominant source to the patent basilar artery. Right vertebral does rise to left PICA. Left vertebral flow is not identified.
Posterior circulation otherwise symmetric.
Bilateral cavernous and supraclinoid ICA plaque.
Atheromatous plaque with focal approximately 40-50% stenosis left cavernous ICA axial image 154.
Less than 50% stenosis right cavernous ICA.
Left A1 segment is hypoplastic. Anterior communicating artery is patent with symmetric pericallosal flow preserved.
Middle circulation is symmetric.
Dural sinus opacification is homogeneous.
No filling defect in the right supraclinoid or M1 segment, artifact on recent unenhanced CT.
IMPRESSION: 
IMPRESSION: 1. Head CTA is negative for LVO (large vessel occlusion).
Patent right supraclinoid and M1 segment with luminal artifact on previous unenhanced head CT.
2. Bilateral cavernous and supraclinoid ICA plaque with 40-50% stenosis cavernous left ICA. Less than 50% stenosis right cavernous ICA.
3 Left vertebral flow is not identified. Dominant right vertebral gives rise to basilar and supplies the left PICA.
See  neck CTA.

## 2022-07-16 IMAGING — CT CT HEAD WITHOUT CONTRAST
3 of 4 series · 16 of 47 positions shown, 19 images · non-contrast
Comparison: Head CT 03/10/2022
Procedure:  Routine unenhanced protocol
All CT scans at this facility use iterative reconstruction, dose modulation and/or weight based dosing when appropriate to reduce radiation dose to as low as reasonably achievable.

Left sided weakness, facial droop, noticed 10 minutes Prior to arrival
Addendum:
[id number]
\F\[HOSPITAL]\F\
Communicated to: Dr. Choe
On behalf of: Dr. Lordious Denga
By: Nitsuj Darboe
At: [DATE]
On: 07/16/2022 EST
\F\/[HOSPITAL]\F\#)
FINAL REPORT:
CT HEAD WITHOUT CONTRAST
CLINICAL DATA: facial droop x 10 minutes
Technologist note: Left-sided weakness, facial droop, note is 10 minutes prior to arrival.

[Series 2: brain without · axial · non-contrast · 0.54mm/px · z∈[+45,+195]mm · 10 of 36 slices shown, 13 images]
[im 3/36  brain]
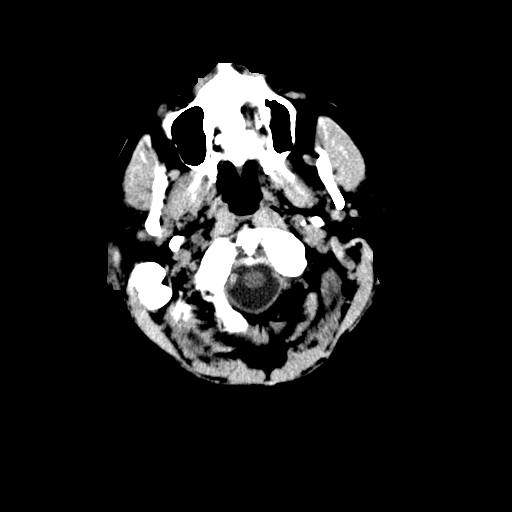
[im 3/36  bone]
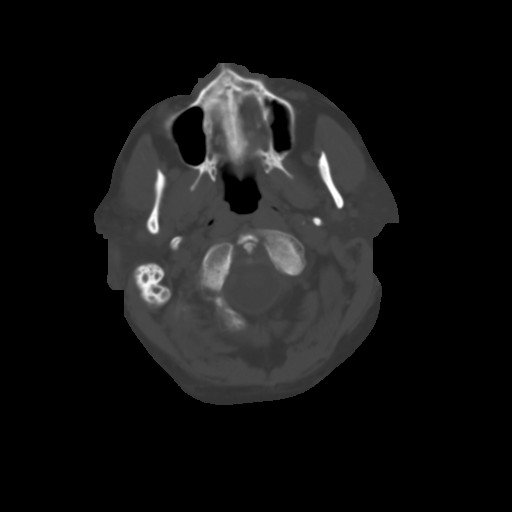
[im 6/36  brain]
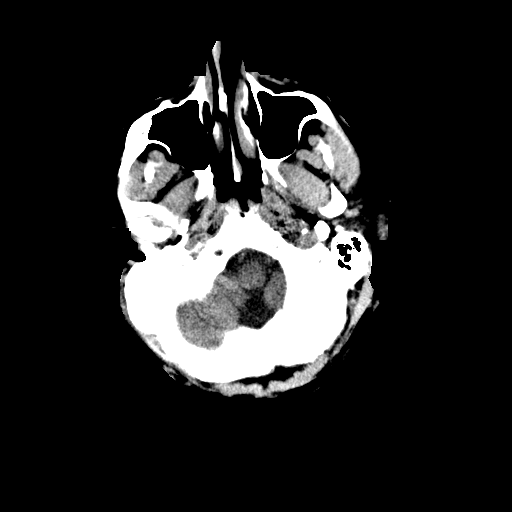
[im 11/36  brain]
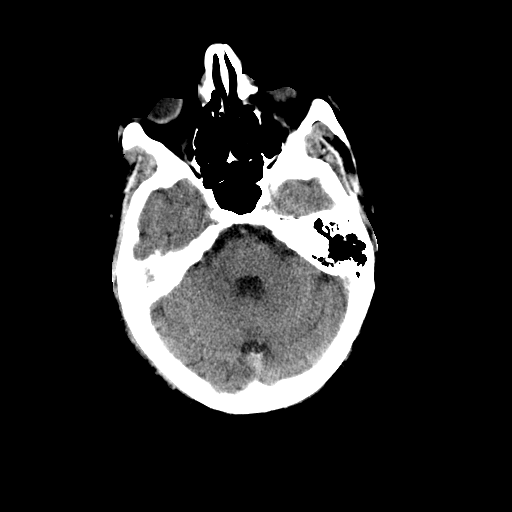
[im 13/36  brain]
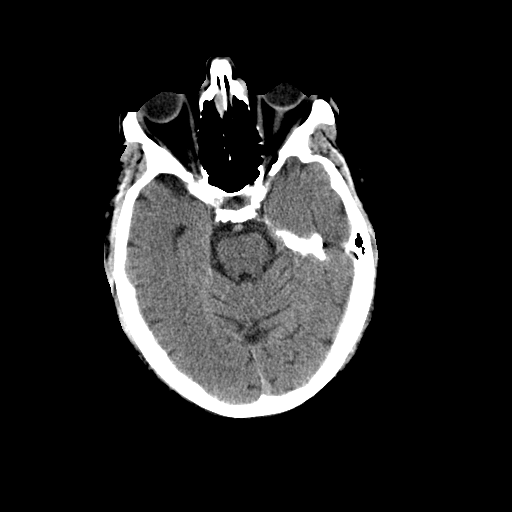
[im 16/36  brain]
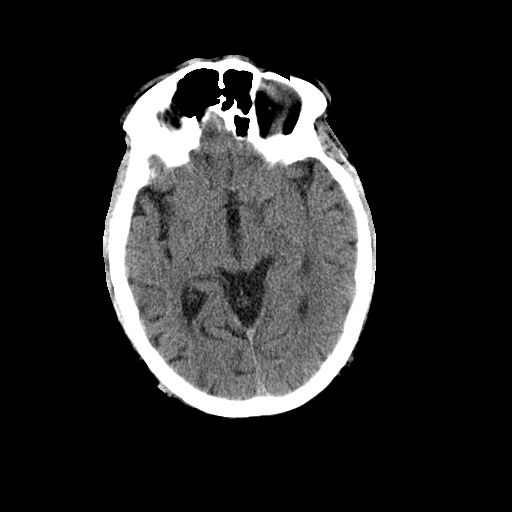
[im 16/36  bone]
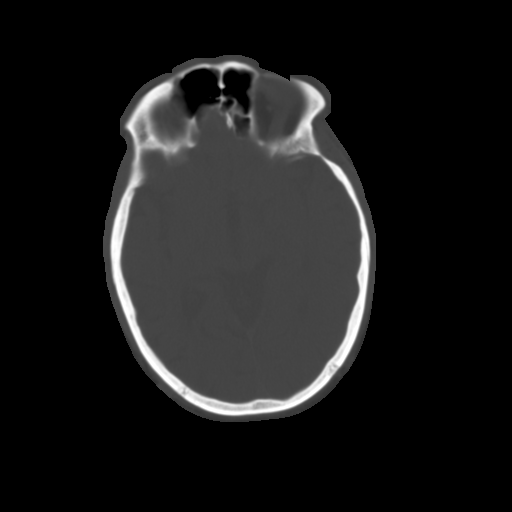
[im 21/36  brain]
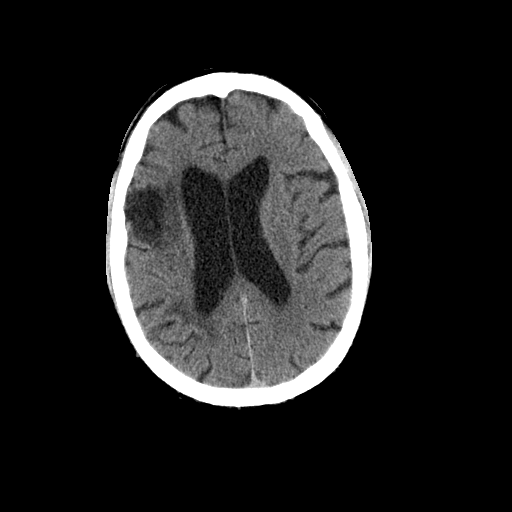
[im 23/36  brain]
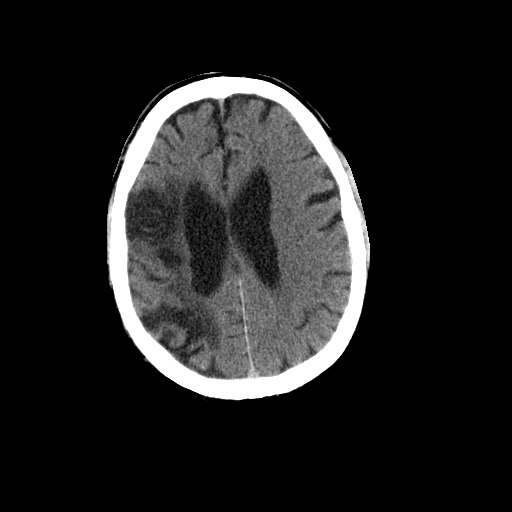
[im 26/36  brain]
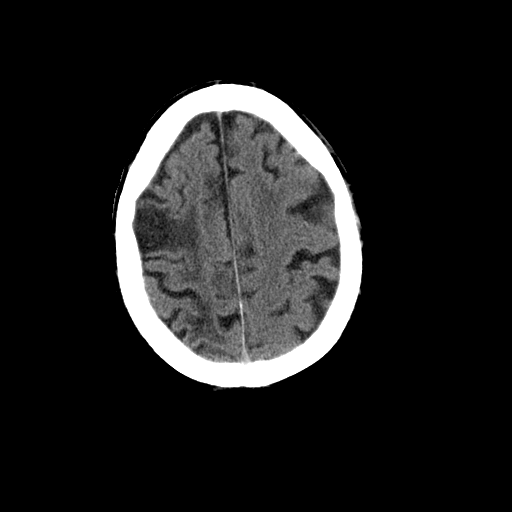
[im 31/36  brain]
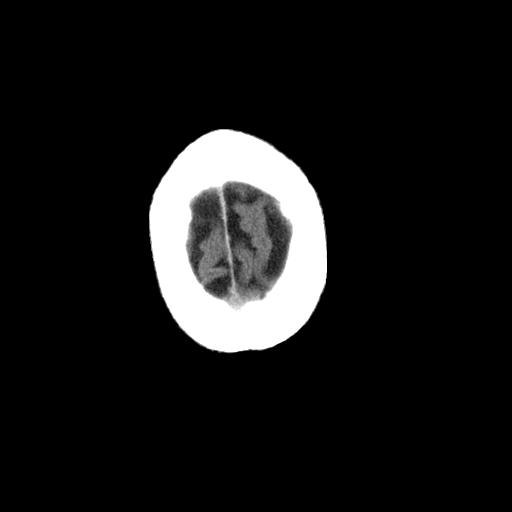
[im 31/36  bone]
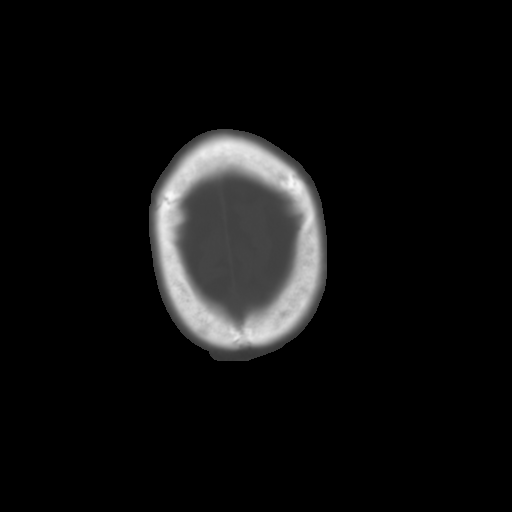
[im 33/36  brain]
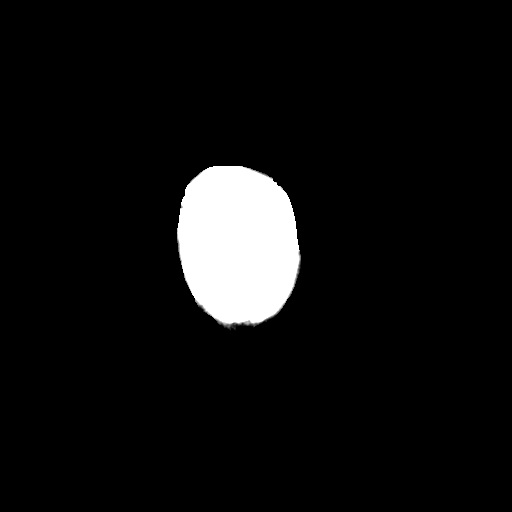

[mpr, brain thin, coronal · coronal · 0.54mm/px · 3 of 103 slices shown]
[im 35/103  brain]
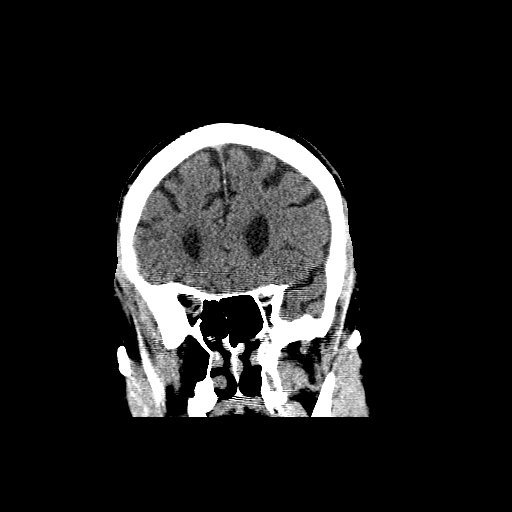
[im 46/103  brain]
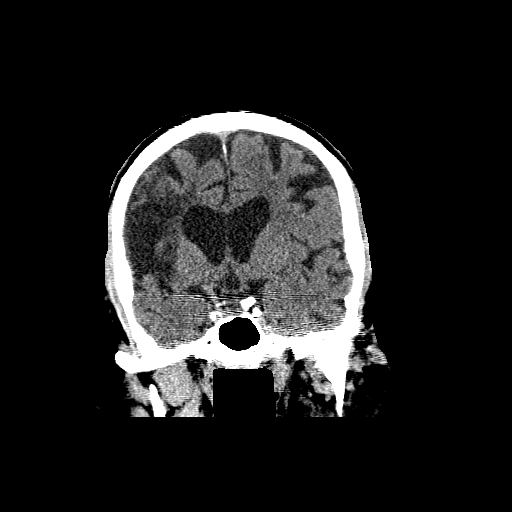
[im 57/103  brain]
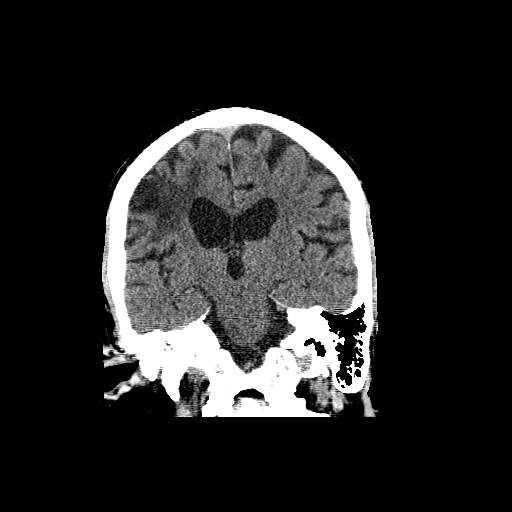

[mpr, brain thin, sagittal · sagittal · 0.54mm/px · 3 of 85 slices shown]
[im 29/85  brain]
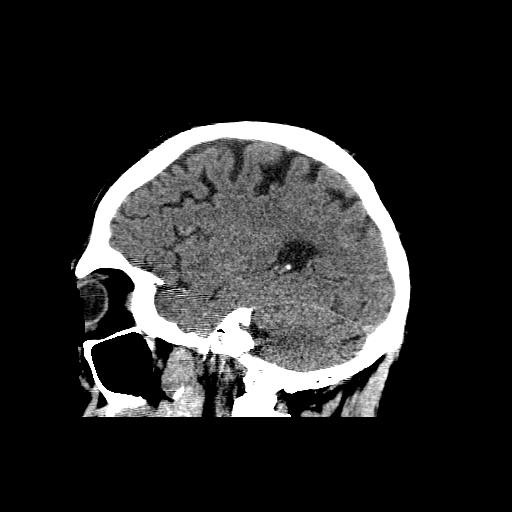
[im 43/85  brain]
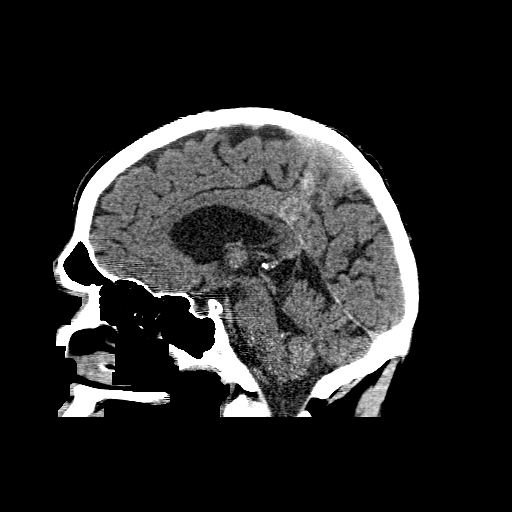
[im 57/85  brain]
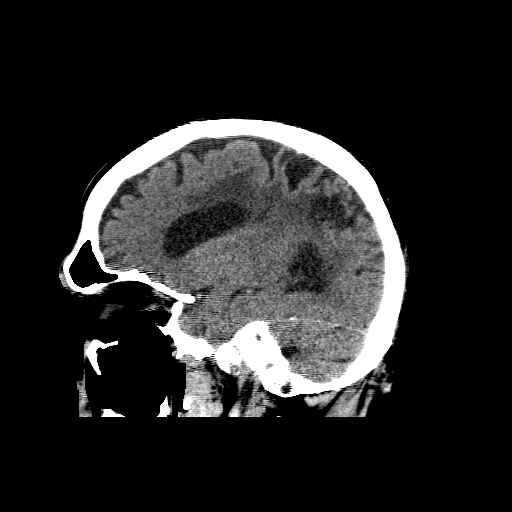

[16 of 47 positions shown; findings below may reference images not displayed]

FINDINGS: Chronic encephalomalacia and gliosis at remote right MCA territory stroke.
Otherwise symmetric mild volume loss.
Gray-white differentiation is otherwise preserved. Insular ribbons are preserved.
Calcified bilateral cavernous and supraclinoid ICA plaque.
Some artifact from skull base with apparent hyperdense vessel sign of the supraclinoid right ICA and proximal most M1 segment.
Asymmetric right mastoid opacification, similar to prior.
Skull base and calvarium are otherwise unremarkable.
IMPRESSION: 
IMPRESSION: :
1. Beam hardening artifact was subtle asymmetry suspicious for hyperdense vessel right supraclinoid/proximal M1 segment.
With acute presentation, recommend CTA.
[DATE] lack imaging correlate in the acute clinical presentation of stroke.
ASPECTS  score: 10/ 10.
3. Remote right MCA territory infarct.
4. Otherwise symmetric volume loss.
5. Chronic right mastoiditis.

## 2022-07-16 IMAGING — CR XR CHEST 1 VIEW
1 series · 1 of 1 positions shown · non-contrast
Comparison: None

FINAL REPORT:
Chest portable one view
INDICATION: OTHER , cough

[AP]
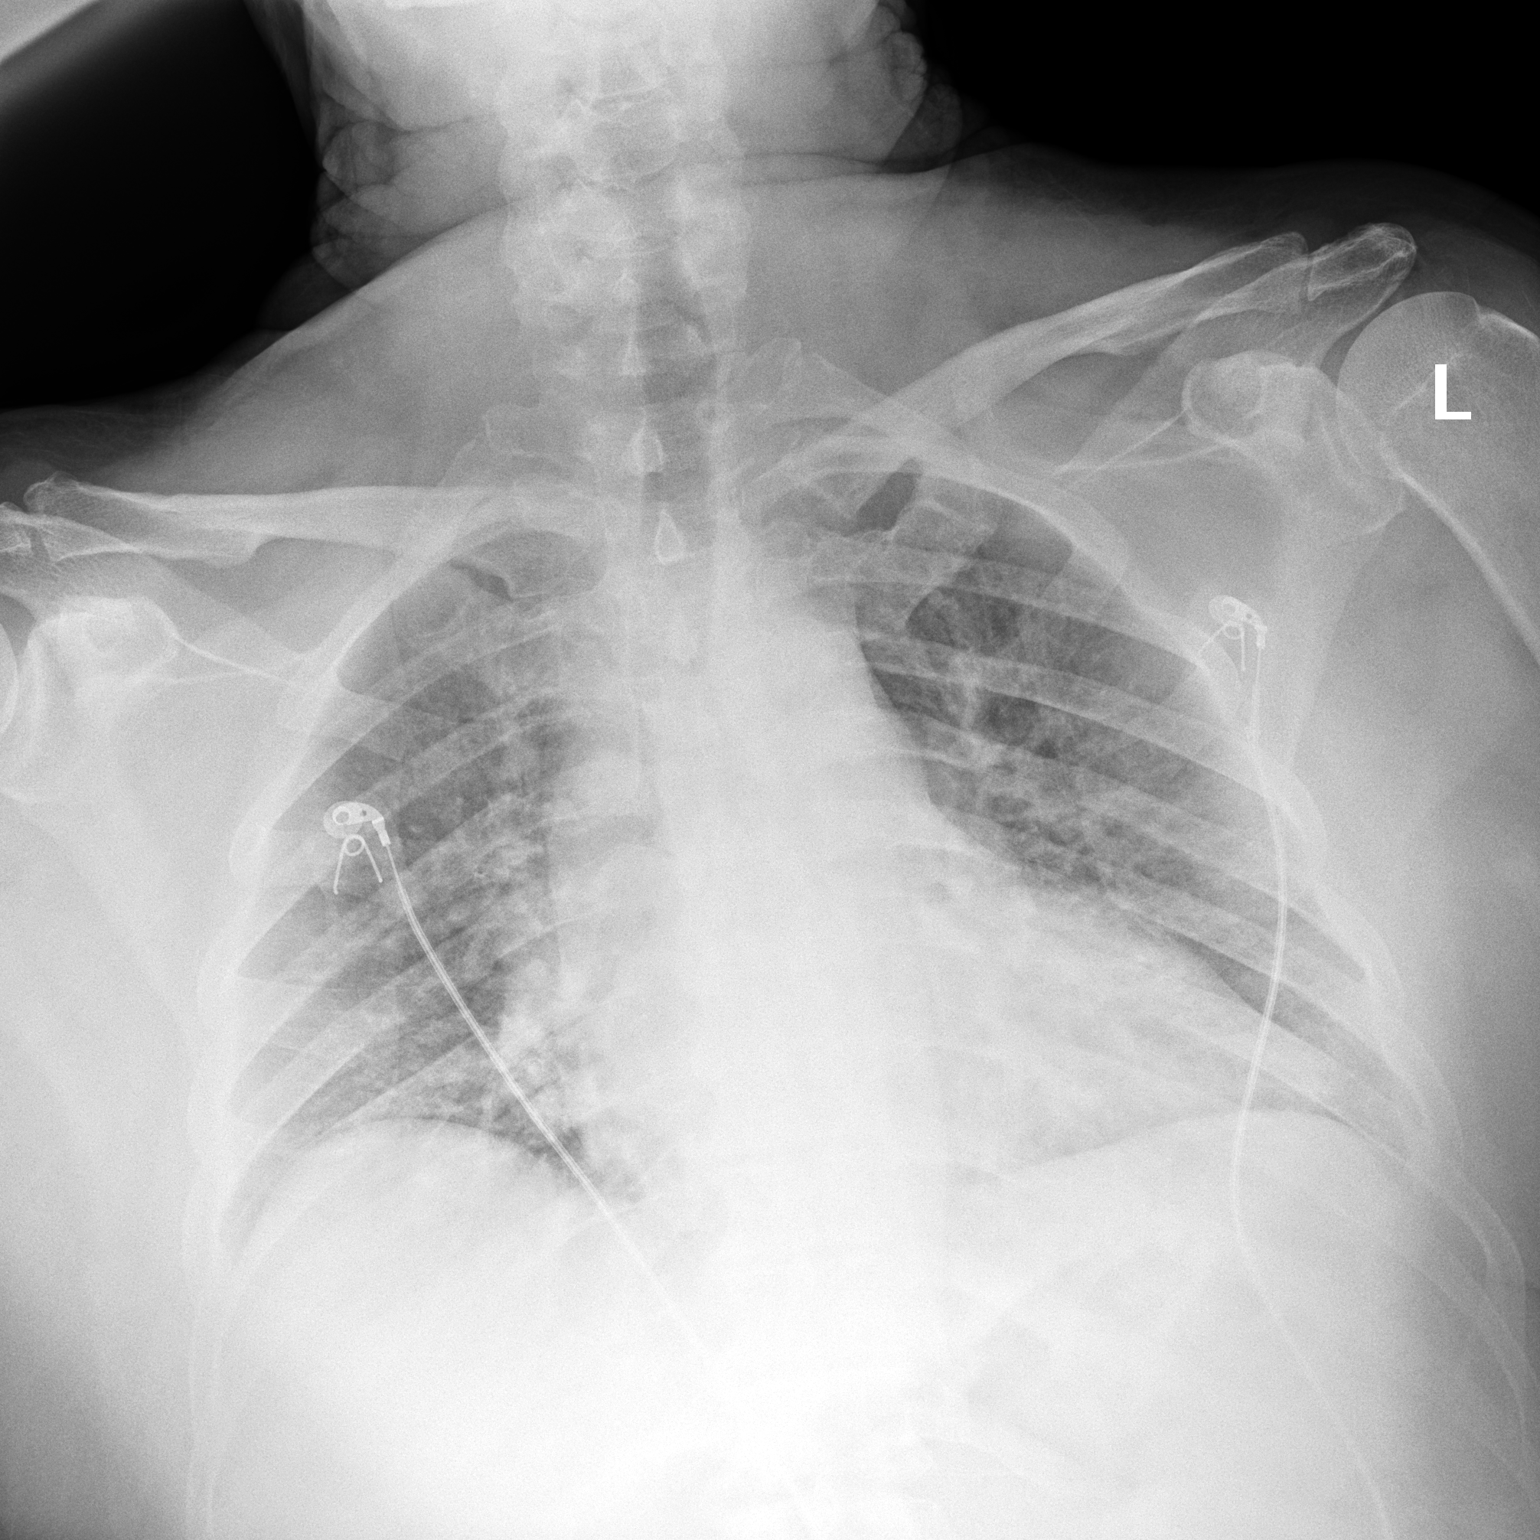

[1 of 1 positions shown; findings below may reference images not displayed]

FINDINGS: The cardiac silhouette is within normal limits   given portable technique.  There is perihilar haziness with peribronchial cuffing. Degenerative changes within the left shoulder. No pneumothorax. No pleural effusion.   No acute osseous abnormalities.
IMPRESSION: 
IMPRESSION: Mild peribronchial cuffing and perihilar haziness, nonspecific, can be seen in viral processes, pulmonary venous hypertension, bronchitis, and/or reactive airways disease.

## 2022-07-16 IMAGING — CT CT NECK ANGIO WITH AND WITHOUT IV CONTRAST
1 of 6 series · 3 of 14 positions shown · non-contrast
Comparison: No prior similar.

CVA, left sided weakness DEEMED EMERGENT BY DR. CABDILAHI
No hx of cancer or surgery, hx of stroke
FINAL REPORT:
CT NECK ANGIO WITH AND WITHOUT IV CONTRAST
CLINICAL DATA: cva

[batch cmpr - right internal carotid · 0.46mm/px · 3 of 22 slices shown]
[im 1/22  soft-tissue]
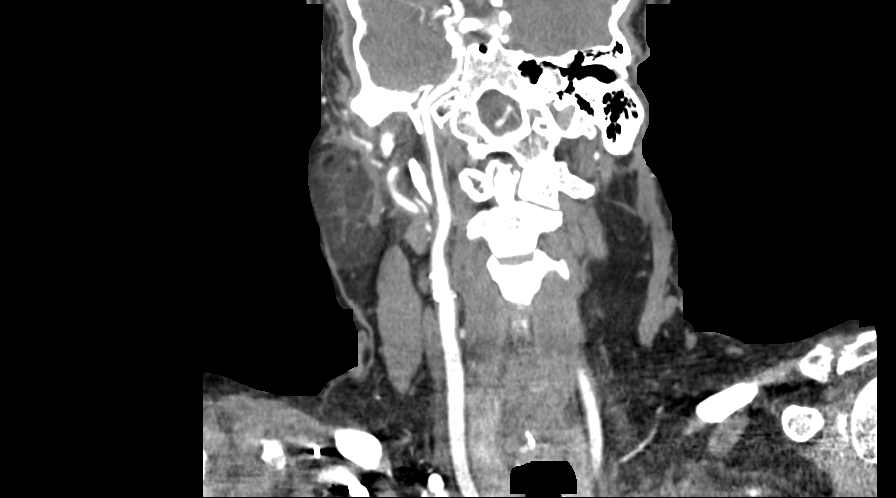
[im 11/22  bone]
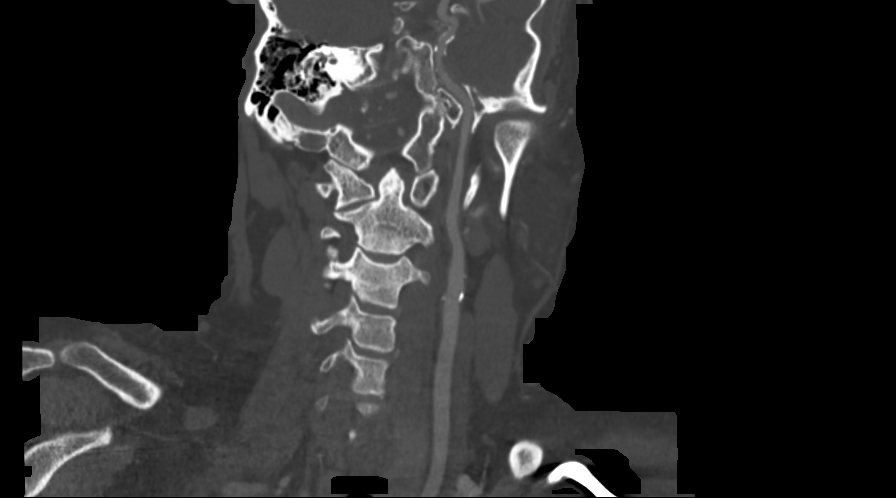
[im 22/22  soft-tissue]
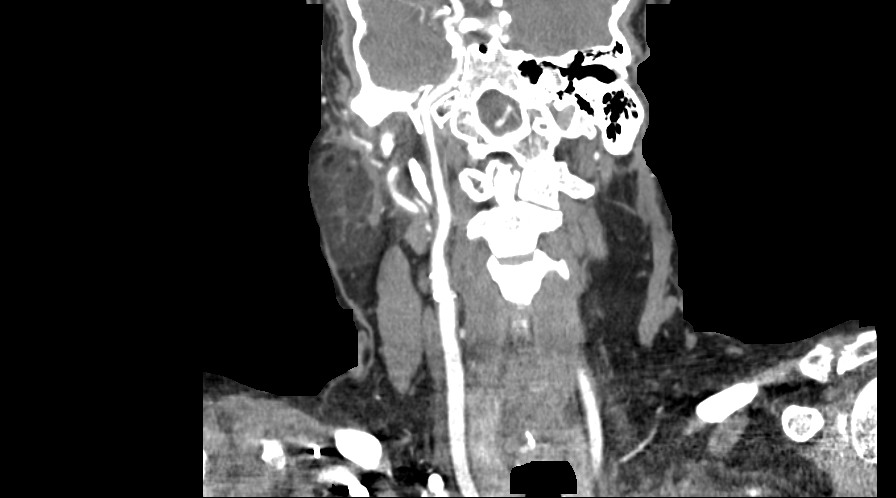

[3 of 14 positions shown; findings below may reference images not displayed]

Same-day head CTA.
PROCEDURE: Routine protocol with axial source images supplemented by 3-D reformats.
All CT scans at this facility use dose modulation and/or weight-based dosing when appropriate to reduce radiation dose to as low as reasonably achievable.
FINDINGS: Mild atheromatous plaque aortic arch. No flow-limiting osteal stenosis of branch vessels.
Right cervical carotid flow is maintained through the neck. Mixed calcified and atheromatous bifurcations/bulbar plaque without measurable ICA stenosis by NASCET CRITERIA. No dissection.
Cerebral carotid flow is maintained through the neck. Calcified bifurcation and bulbar plaque with focal approximately 70% stenosis by NASCET criteria. No dissection.
Right vertebral artery dominant from origin, patent through the neck without stenosis or dissection.
Left vertebral appears occluded at origin. Trickle flow at the left transverse foramen at C5, C3. Minimal luminal flow C2. No opacification of the intracranial segment.
IMPRESSION: 
IMPRESSION: 1. Left vertebral appears occluded at origin with trickle flow shown at the C5 and C3 level, minimal luminal flow at C2 but no flow in the intracranial segment.
CT head CTA, dominant right vertebral continues as the basilar gives rise to the left PICA, supporting chronic compensation.
2. Left cervical carotid plaque with focal approximately 70% stenosis left ICA.
3. Right cervical carotid plaque without measurable stenosis by NASCET criteria.

## 2022-07-17 IMAGING — MR MRI BRAIN WITHOUT CONTRAST
12 of 15 series · 17 of 48 positions shown · non-contrast
Comparison: 03/10/2022

FINAL REPORT:
EXAM: MRI of the brain without contrast.
HISTORY: Stroke, follow up
TECHNIQUE: Multisequence multiplanar images of the brain were obtained.

[Series 2: survey_mpr_sag · sagittal · 1.6mm · 1.60mm/px · 1 of 5 slices shown]
[im 1/5]
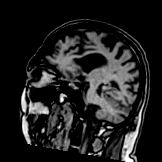

[Series 3: survey_mpr_cor · coronal · 1.6mm · 1.60mm/px · 1 of 3 slices shown]
[im 1/3]
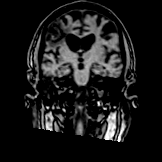

[Series 4: survey_mpr_tra · axial · 1.6mm · 1.60mm/px · 1 of 3 slices shown]
[im 1/3]
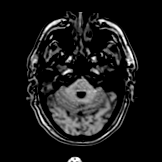

[Series 5: T1 · sagittal · 5.0mm · 0.72mm/px · 1 of 25 slices shown (1 of 2)]
[im 1/25]
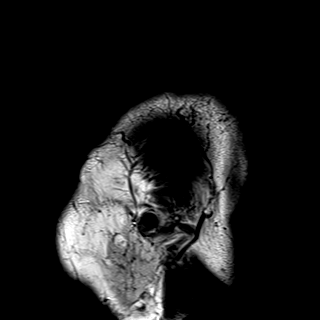

[Series 6: ax dwi_tracew · axial · 5.0mm · 0.60mm/px · 1 of 27 slices shown]
[im 1/27]
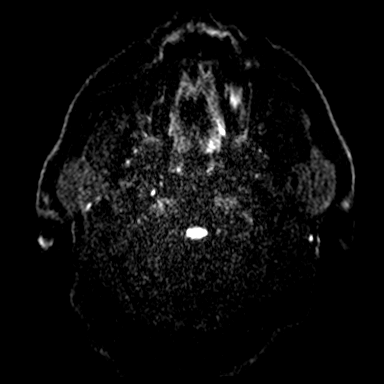

[Series 7: ax dwi_adc · axial · 5.0mm · 0.60mm/px · 1 of 27 slices shown]
[im 1/27]
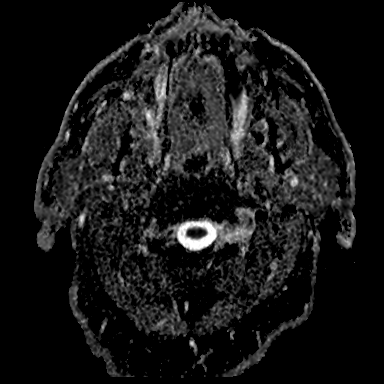

[Series 8: ax dwi_exp · axial · 5.0mm · 0.60mm/px · 1 of 27 slices shown]
[im 1/27]
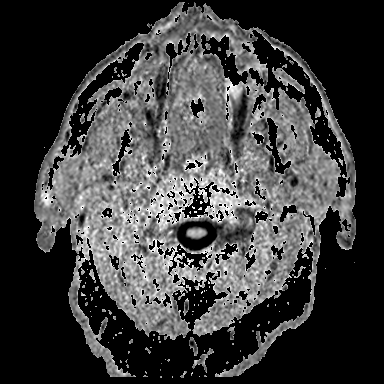

[Series 12: T2 fat-sat · axial · 5.0mm · 0.51mm/px · z∈[-69,+81]mm · 2 of 27 slices shown (1 of 3)]
[im 1/27]
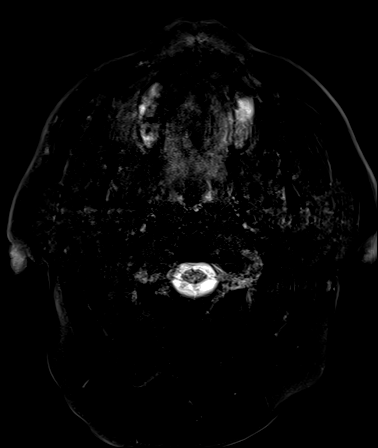
[im 27/27]
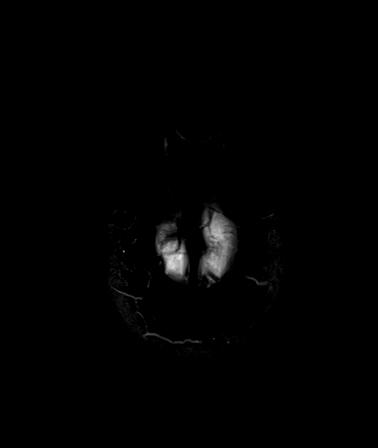

[Series 13: GRE · axial · 5.0mm · 0.45mm/px · z∈[-69,+81]mm · 2 of 27 slices shown]
[im 1/27]
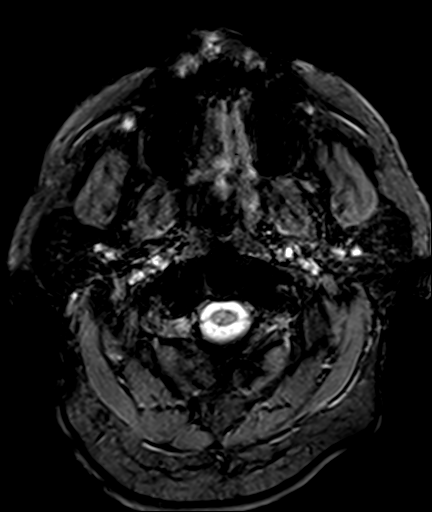
[im 27/27]
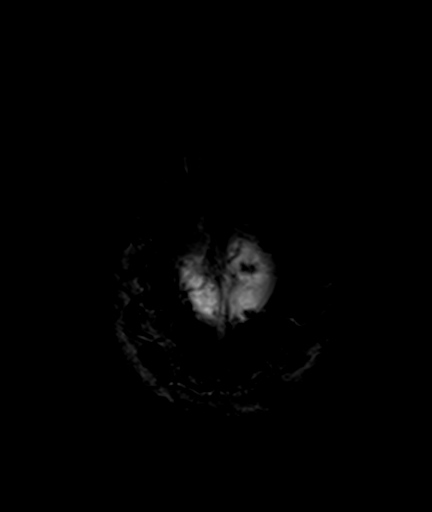

[Series 14: T2 fat-sat · axial · 5.0mm · 0.51mm/px · z∈[-69,+81]mm · 2 of 27 slices shown (2 of 3)]
[im 1/27]
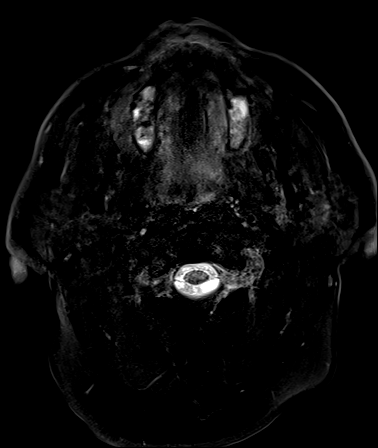
[im 27/27]
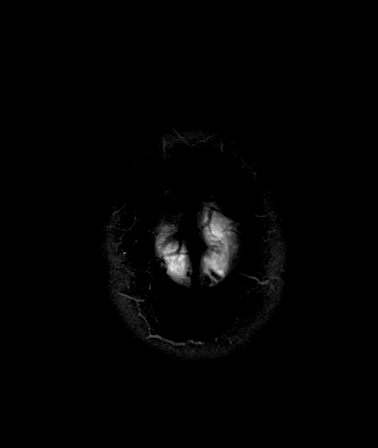

[Series 15: T1 · axial · 5.0mm · 0.72mm/px · z∈[-69,+81]mm · 2 of 27 slices shown (2 of 2)]
[im 1/27]
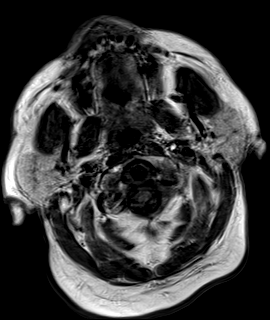
[im 27/27]
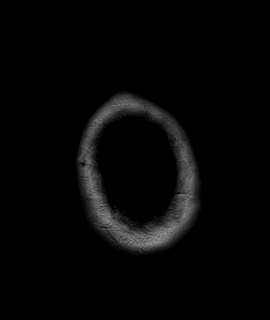

[Series 16: T2 fat-sat · axial · 5.0mm · 0.72mm/px · z∈[-71,+78]mm · 2 of 27 slices shown (3 of 3)]
[im 1/27]
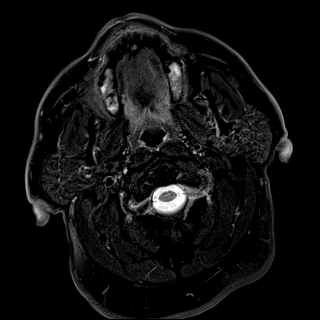
[im 27/27]
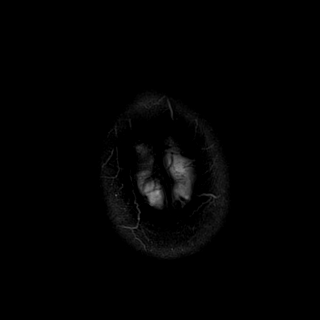

[17 of 48 positions shown; findings below may reference images not displayed]

FINDINGS: No midline shift. Basal cisterns are patent.  There are several areas of encephalomalacia including the right frontal and parietal lobes. There is confluent T2 hyperintensity within the periventricular white matter. There is ex vacuo dilation of the right lateral ventricle. No evidence for acute infarct, intracranial hemorrhage or mass lesion. No extra-axial fluid collections.
Intracranial vascular flow voids are normal.
Orbital contents are unremarkable.
IAC are symmetric and unremarkable.
Paranasal sinuses are clear. There is persistent dense opacification of the right mastoid air cells..
IMPRESSION: Extensive white matter disease with chronic infarct. No acute intracranial abnormality.
Persistent dense right mastoid air cell opacification.

## 2022-09-05 IMAGING — US US CAROTID DOPPLER BILATERAL
1 series · 13 of 24 positions shown · non-contrast
Comparison: none

FINAL REPORT:
REASON FOR EXAM: Carotid Artery Disease
STUDY PERFORMED: Bilateral carotid artery duplex.

[Series 1: us carotid doppler bilateral · 13 of 31 slices shown]
[im 1/31]
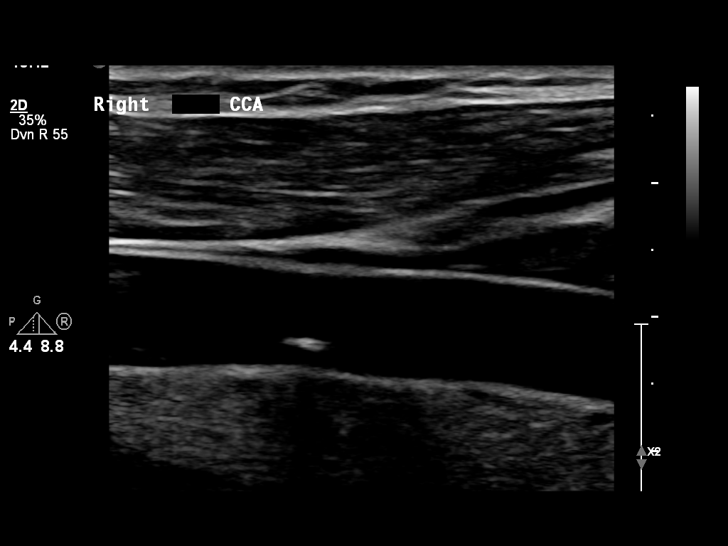
[im 3/31]
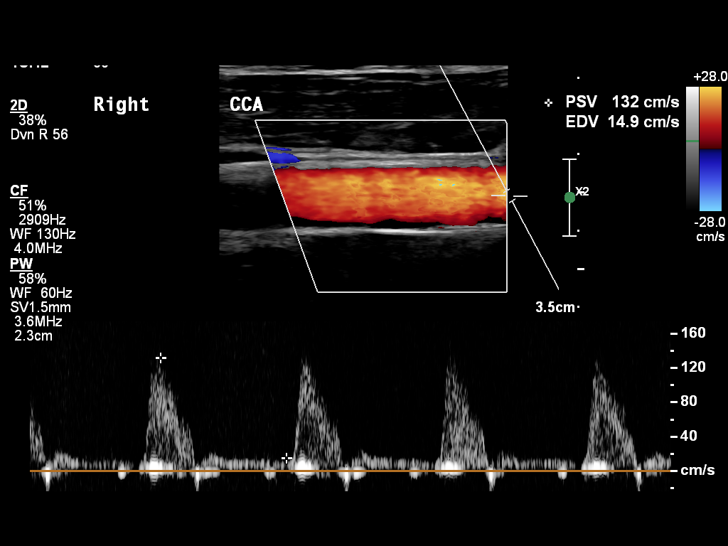
[im 6/31]
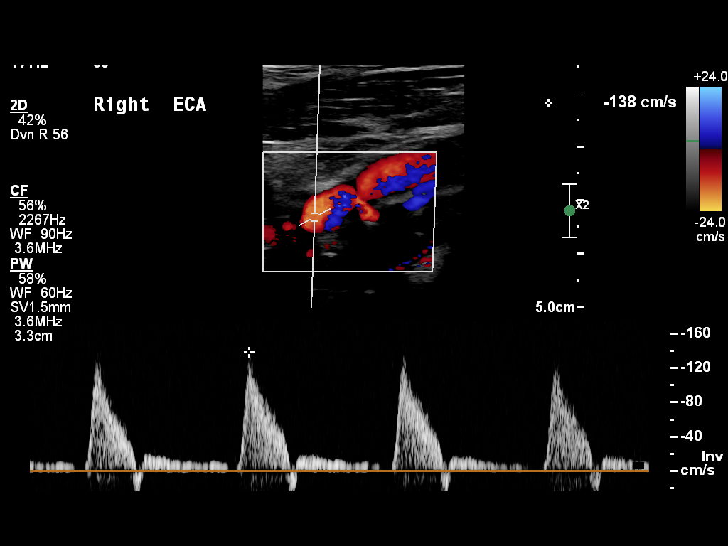
[im 8/31]
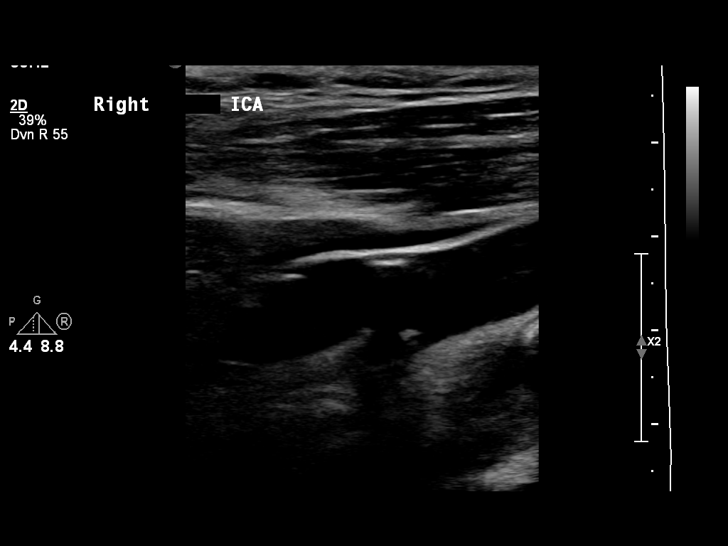
[im 11/31]
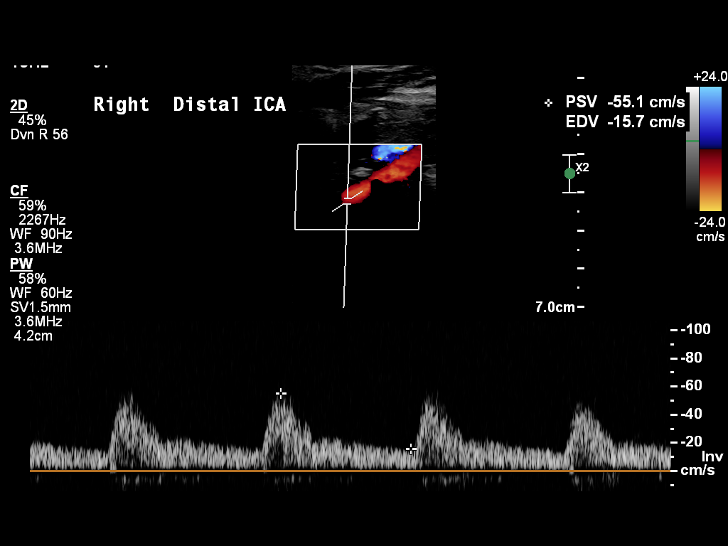
[im 14/31]
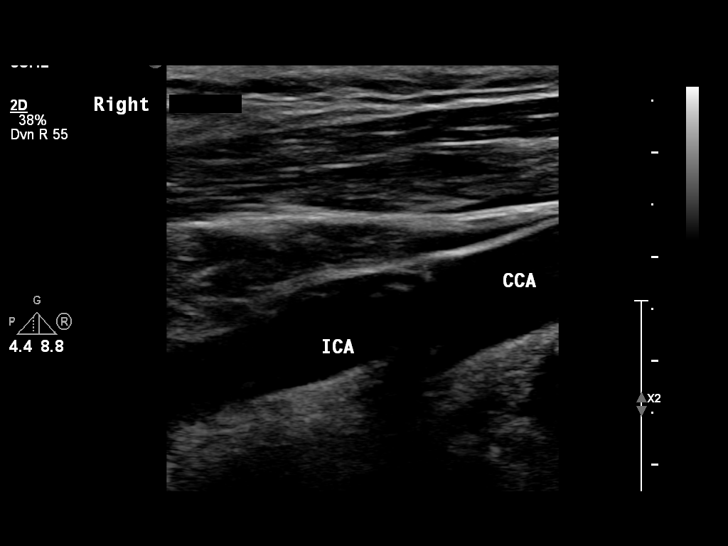
[im 16/31]
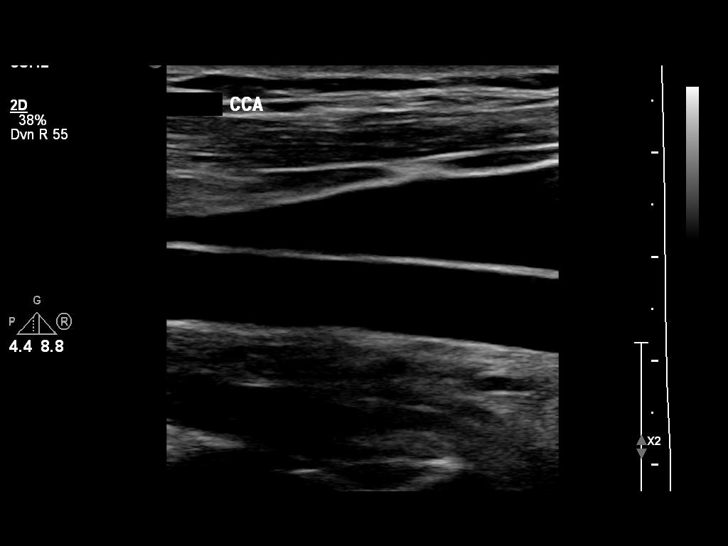
[im 17/31]
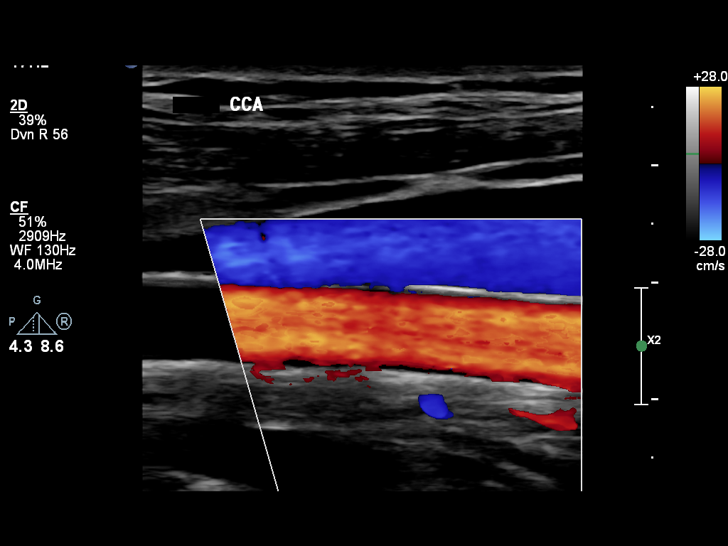
[im 20/31]
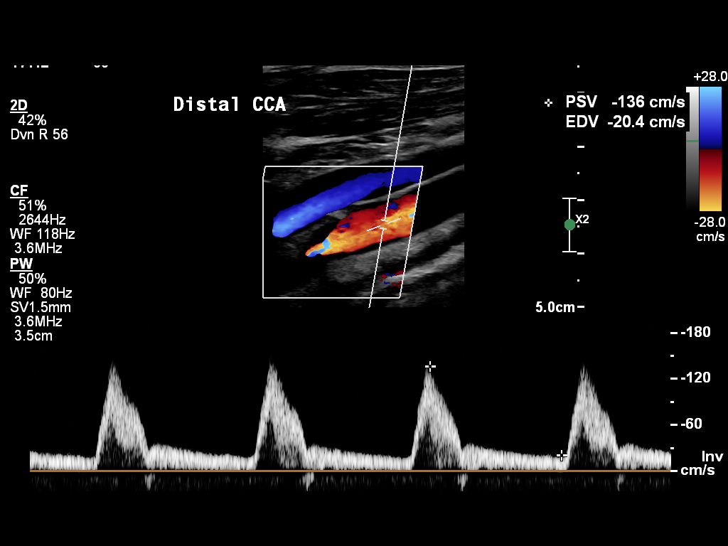
[im 23/31]
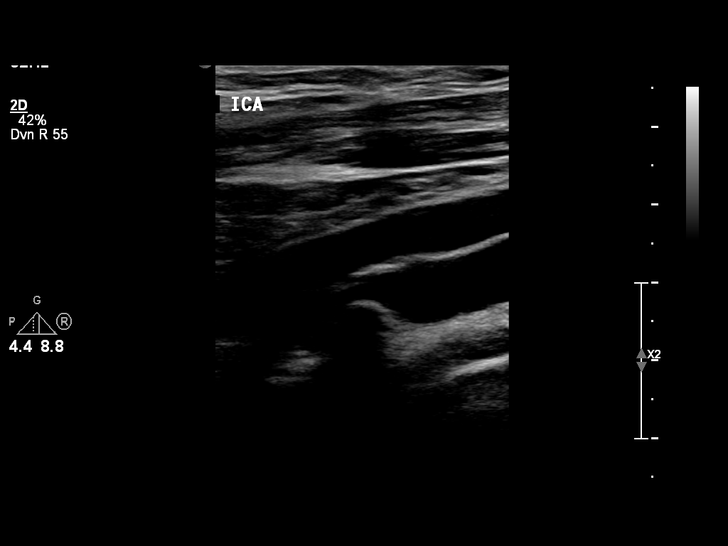
[im 25/31]
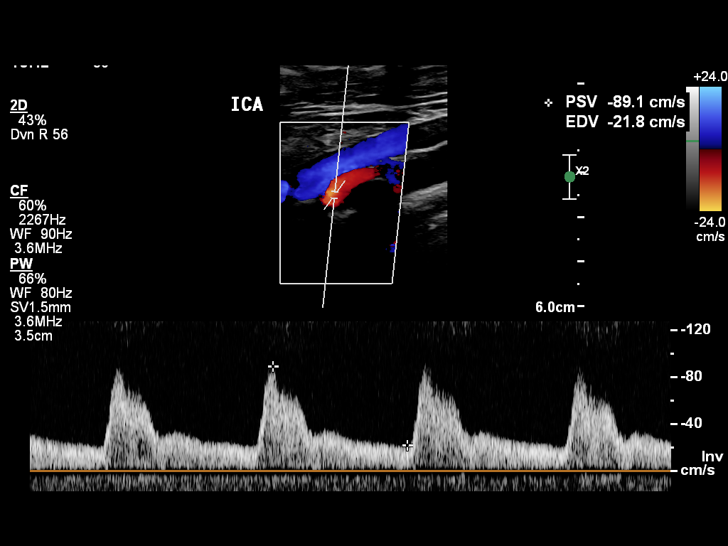
[im 28/31]
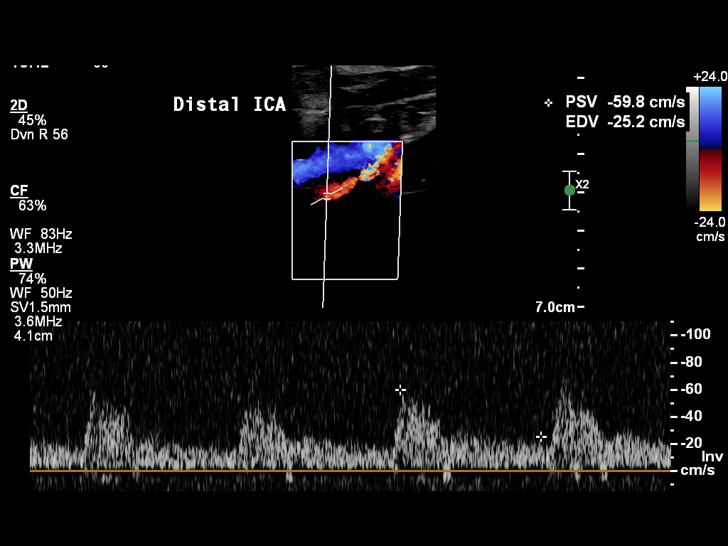
[im 31/31]
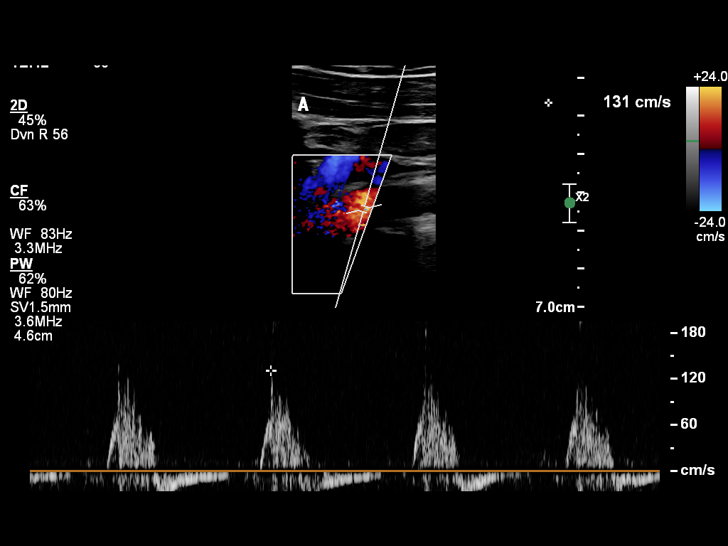

[13 of 24 positions shown; findings below may reference images not displayed]

FINDINGS: RIGHT:
The right common and internal carotid artery demonstrate mild heterogeneous plaque on grayscale imaging. Duplex imaging shows mild spectral broadening. The common carotid artery demonstrates velocities of 95 cm/s. The external carotid artery demonstrates velocities of 138 cm/s. The maximal internal carotid artery velocities are 89/21 cm/s with an ICA:CCA ratio of 0.9. The vertebral artery demonstrates antegrade flow and velocities of 78 cm/s. The subclavian artery demonstrates biphasic waveforms and velocities of 166 cm/s.
LEFT:
The left common and internal carotid artery demonstrate minimal plaque on grayscale imaging. Duplex imaging shows mild spectral broadening. The common carotid artery demonstrates velocities of 136 cm/s. The external carotid artery demonstrates velocities of 109 cm/s. The maximal internal carotid artery velocities are 113/22 cm/s with an ICA:CCA ratio of 0.8. The vertebral artery demonstrates antegrade flow and velocities of 21 cm/s. The subclavian artery demonstrates biphasic waveforms and velocities of 131 cm/s.
IMPRESSION: 1.  The right internal carotid demonstrates less than 50% stenosis based on diagnostic criteria. The right vertebral artery flow is antegrade.
2.  The left internal carotid demonstrates less than 50% stenosis based on diagnostic criteria. The left vertebral artery flow is antegrade.

## 2023-03-06 IMAGING — US US CAROTID DOPPLER BILATERAL
1 series · 13 of 24 positions shown · non-contrast
Comparison: none

FINAL REPORT:
REASON FOR EXAM: Carotid Artery Disease
STUDY PERFORMED: Bilateral carotid artery duplex.

[Series 1: us carotid doppler bilateral · 13 of 34 slices shown]
[im 1/34]
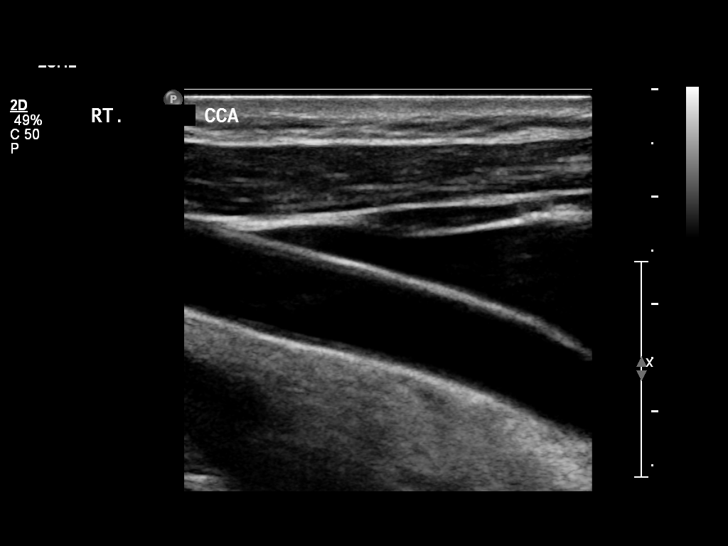
[im 3/34]
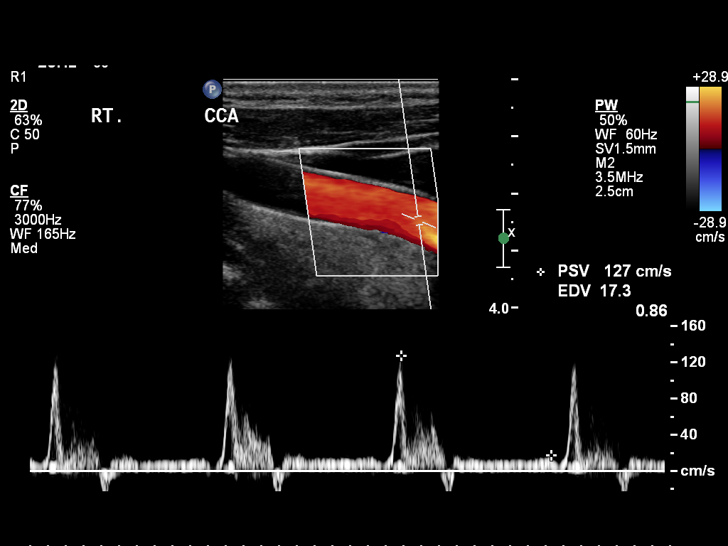
[im 6/34]
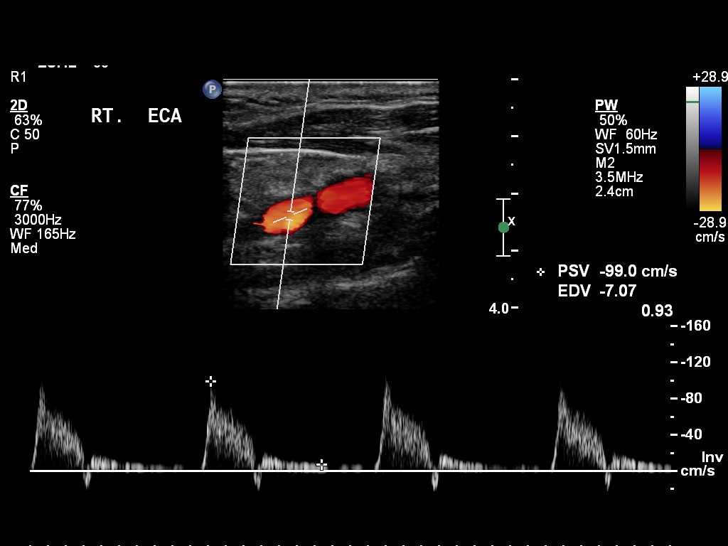
[im 9/34]
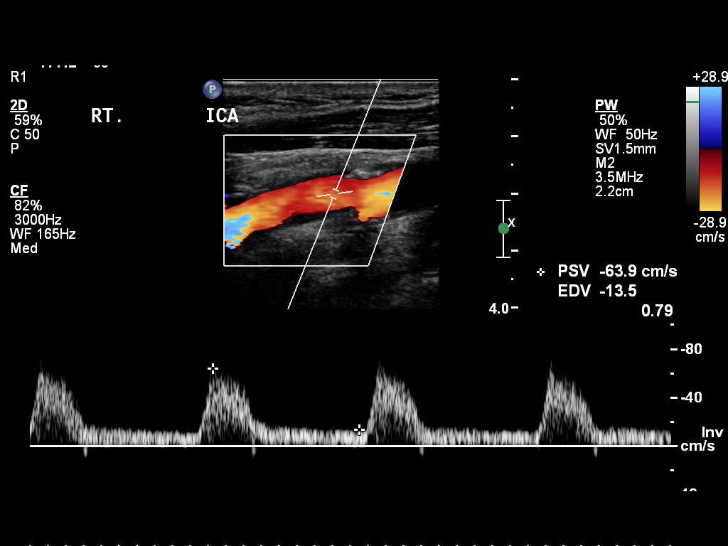
[im 12/34]
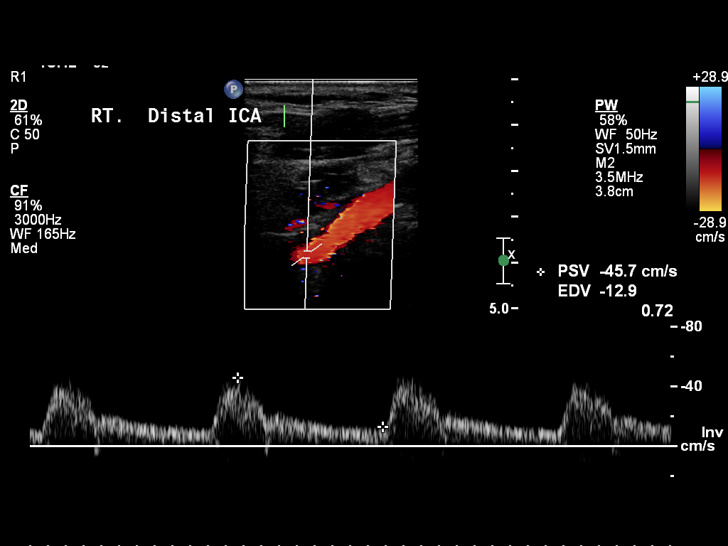
[im 15/34]
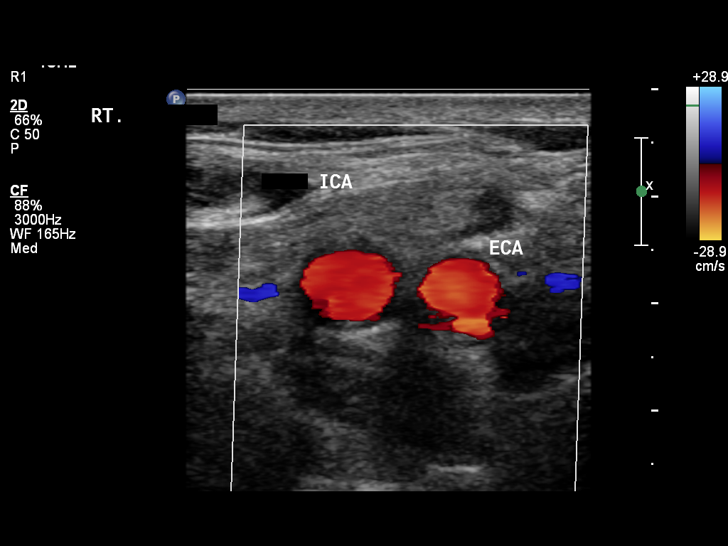
[im 18/34]
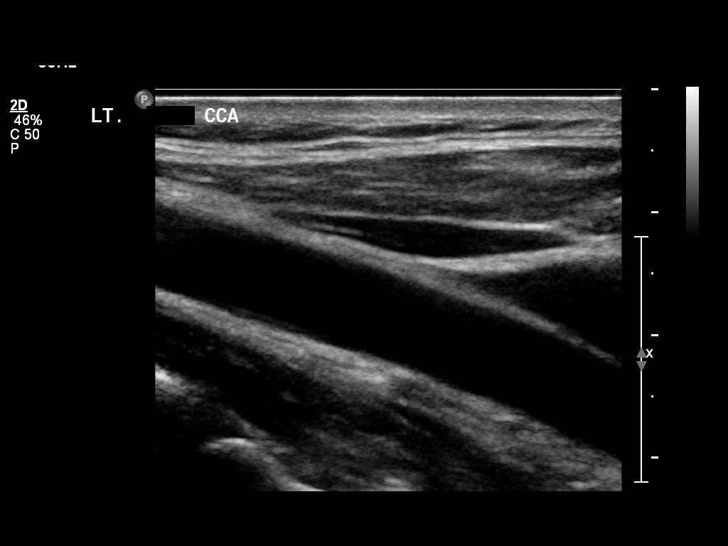
[im 19/34]
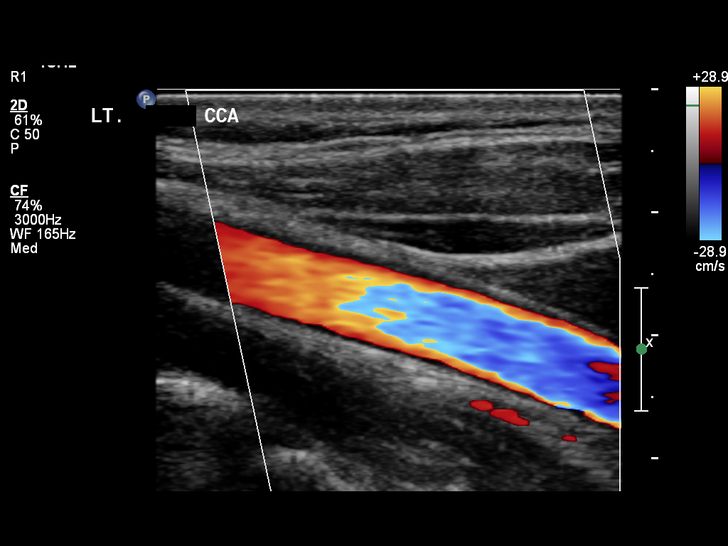
[im 22/34]
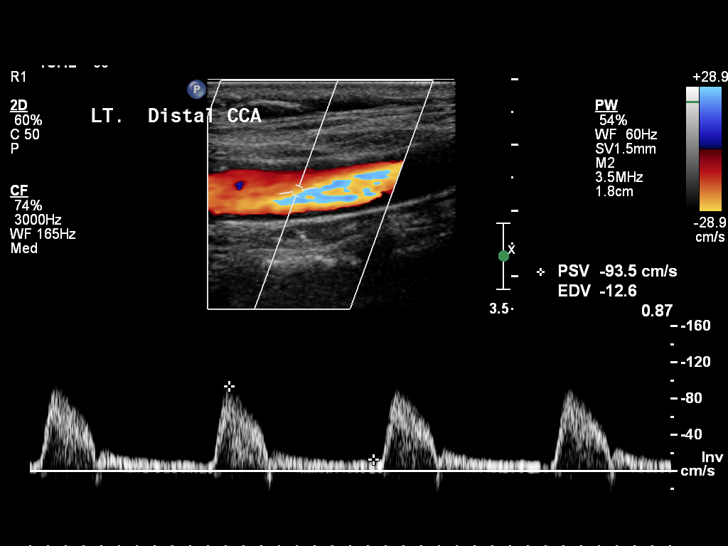
[im 25/34]
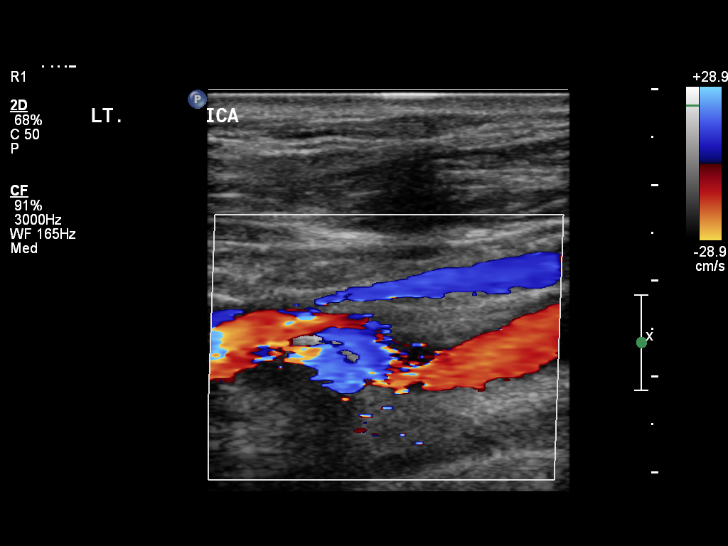
[im 28/34]
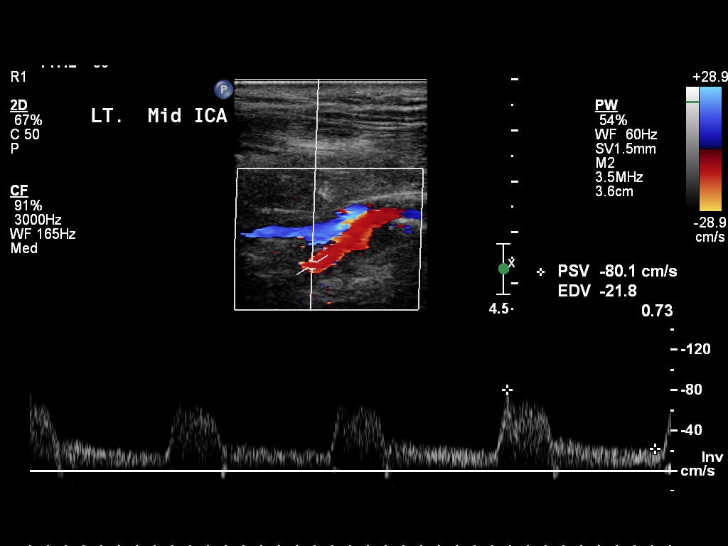
[im 31/34]
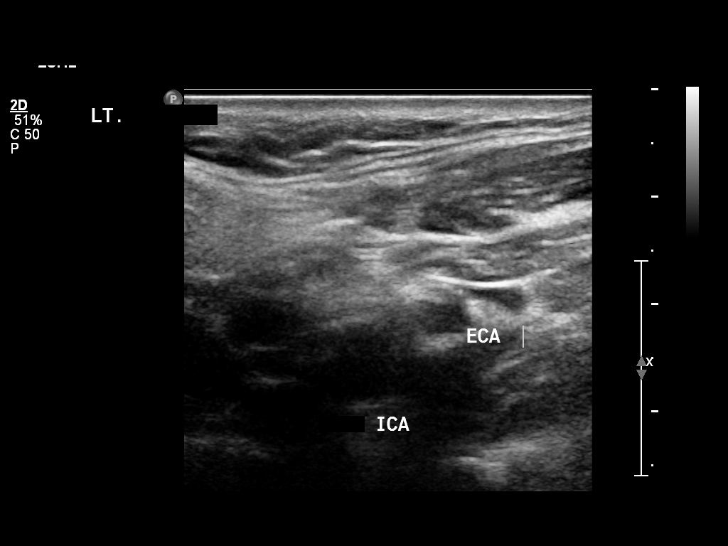
[im 34/34]
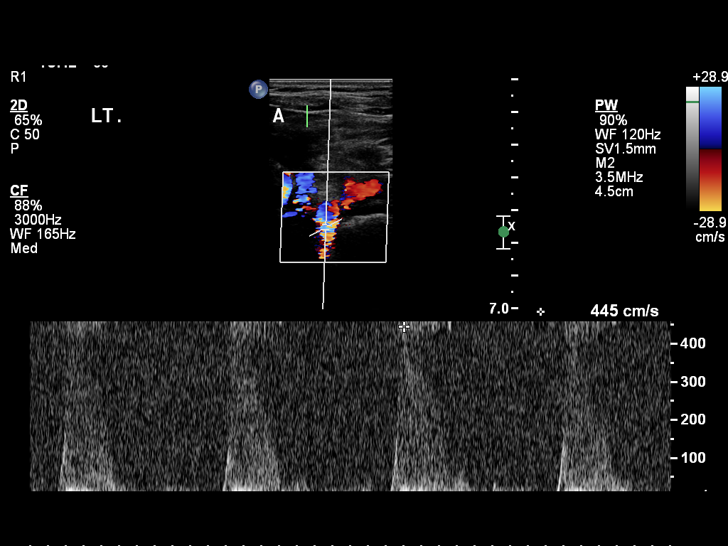

[13 of 24 positions shown; findings below may reference images not displayed]

FINDINGS: RIGHT:
The right common and internal carotid artery demonstrate mild heterogeneous plaque on grayscale imaging. Duplex imaging shows mild spectral broadening. The common carotid artery demonstrates velocities of 71 cm/s. The external carotid artery demonstrates velocities of 99 cm/s. The maximal internal carotid artery velocities are 64/14 cm/s with an ICA:CCA ratio of 0.9. The vertebral artery demonstrates antegrade flow and velocities of 53 cm/s. The subclavian artery demonstrates triphasic waveforms and velocities of 236 cm/s.
LEFT:
The left common and internal carotid artery demonstrate mild heterogeneous plaque on grayscale imaging. Duplex imaging shows mild spectral broadening. The common carotid artery demonstrates velocities of 94 cm/s. The external carotid artery demonstrates velocities of 91 cm/s. The maximal internal carotid artery velocities are 86/20 cm/s with an ICA:CCA ratio of 0.91. The vertebral artery demonstrates retrograde flow and velocities of 23 cm/s. The subclavian artery demonstrates biphasic waveforms and velocities of 445 cm/s.
IMPRESSION: 1.  The right internal carotid demonstrates less than 50% stenosis based on diagnostic criteria. The right vertebral artery flow is antegrade.
2.  The left internal carotid demonstrates less than 50% stenosis based on diagnostic criteria. The left vertebral artery flow is retrograde. Left subclavian artery stenosis noted.

## 2023-03-06 IMAGING — US US ABDOMINAL AORTA ANEURYSM SCREENING WITHOUT DUPLEX DOPPLER
1 series · 13 of 25 positions shown · non-contrast
Comparison: none

FINAL REPORT:
Exam: Aortoiliac duplex.
REASON FOR EXAM: Evaluate abdominal aortic aneurysm.
Results:
On grayscale imaging, the proximal aorta measures 2.2 cm. The mid aorta measures 1.8 cm. The distal aorta measures 1.7 cm. The right common iliac artery measures 8.0 mm and the left common iliac artery measures 7.4 mm. On color Doppler, the proximal aorta demonstrates velocities of 76 cm/s, mid aorta 81 cm/s and distal aorta 105 cm/s. The right distal external iliac artery demonstrates normal biphasic waveforms and velocities of 183 cm/s, mid external iliac artery velocities up to 40 cm/s in proximal external iliac artery velocities of 130 cm/s. The distal right common iliac artery demonstrates triphasic waveforms and velocities of 167 cm/s and proximal right common iliac artery velocities of 191 cm/s. The mid left external iliac artery demonstrates biphasic waveforms and velocities at 312 cm/s in the proximal external iliac artery demonstrates velocities of 154 cm/s. The distal left common iliac artery demonstrates velocities of 130 cm/s and proximal common iliac artery velocities of
156 cm/s.

[Series 1: us abdominal aorta aneurysm screening without dupl · non-contrast · 13 of 26 slices shown]
[im 1/26]
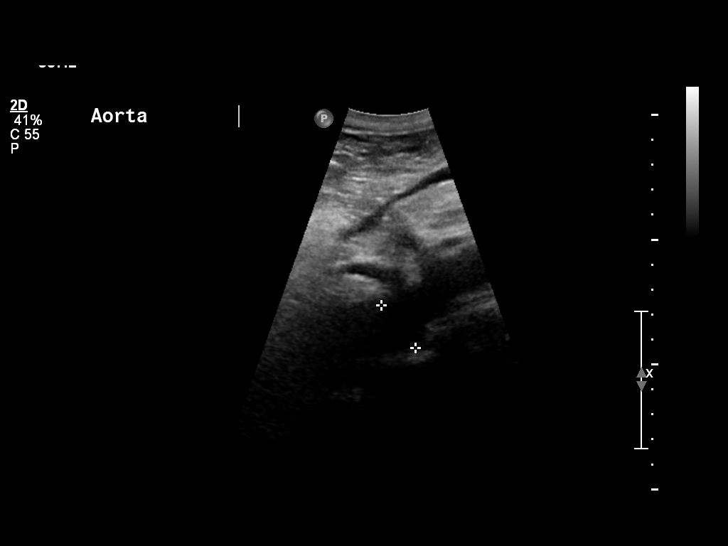
[im 3/26]
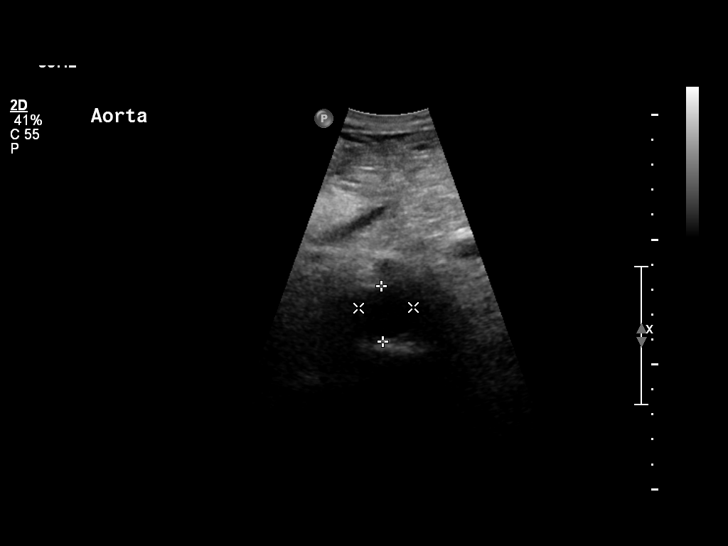
[im 5/26]
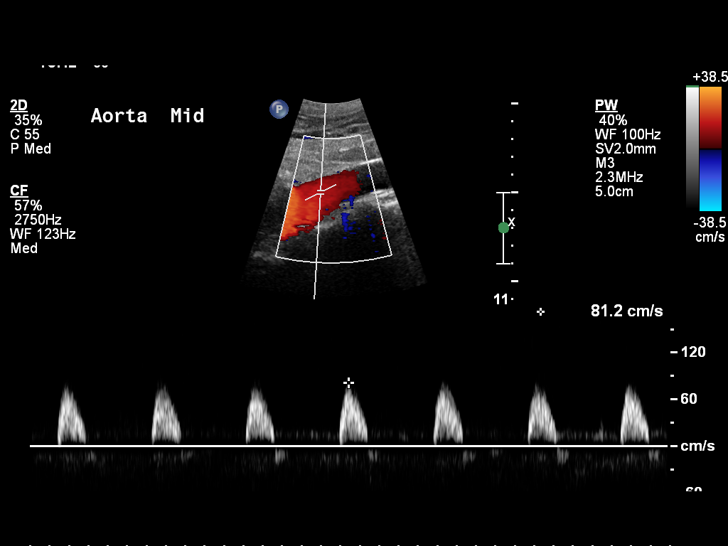
[im 7/26]
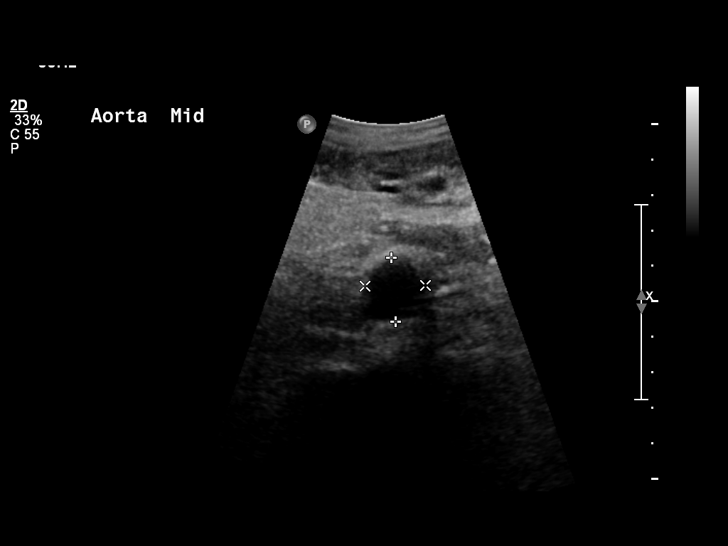
[im 9/26]
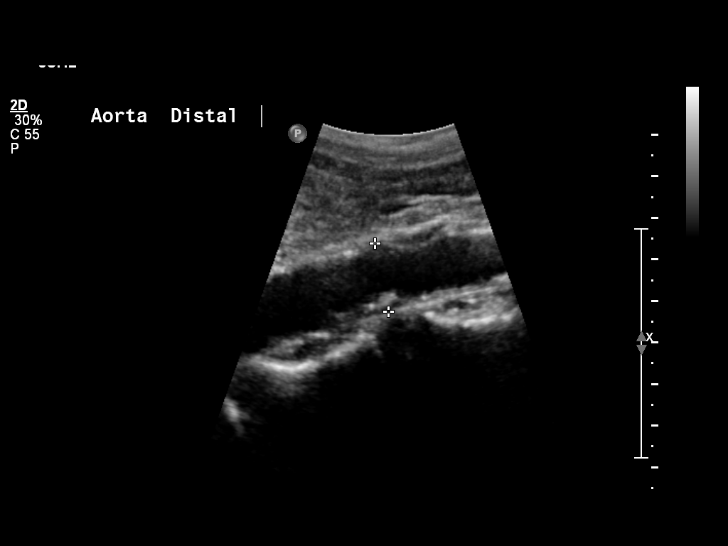
[im 11/26]
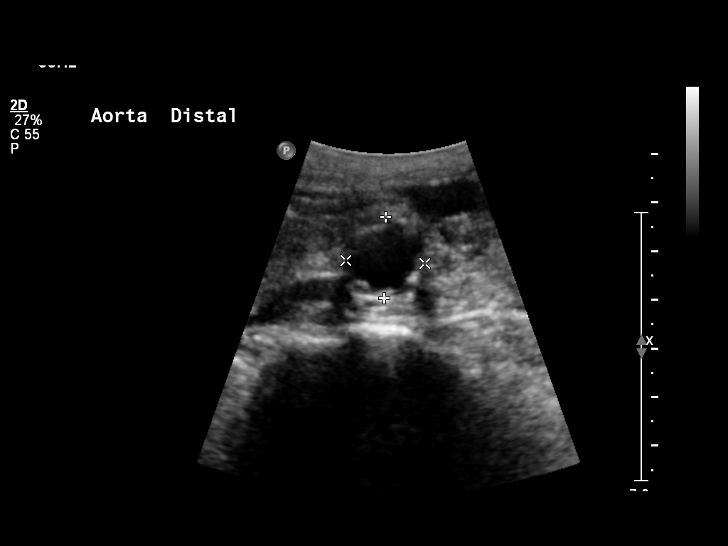
[im 13/26]
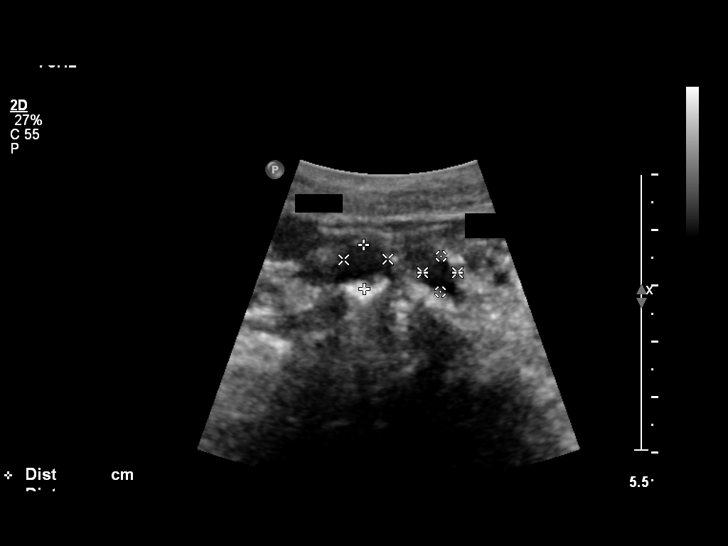
[im 15/26]
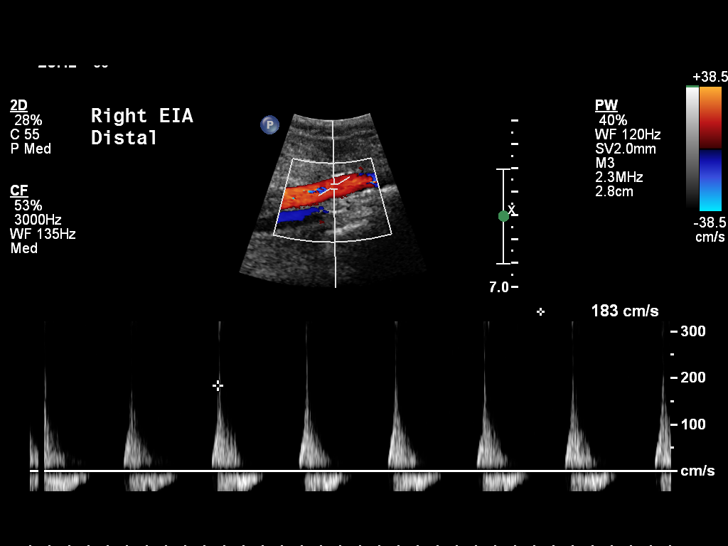
[im 17/26]
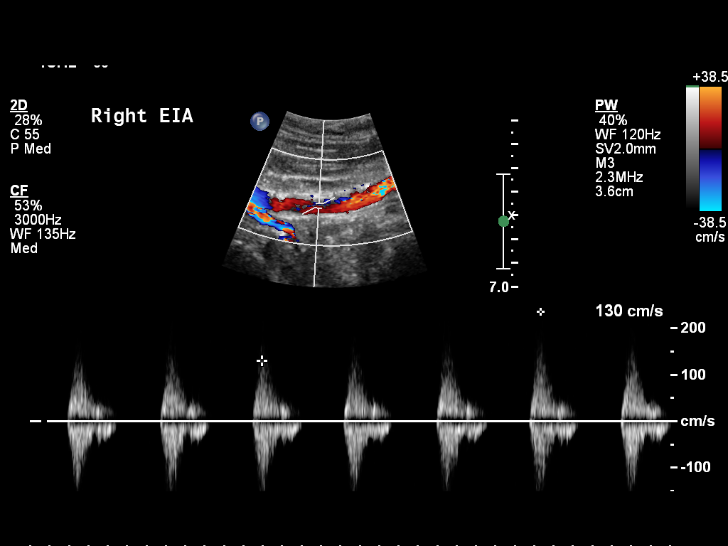
[im 19/26]
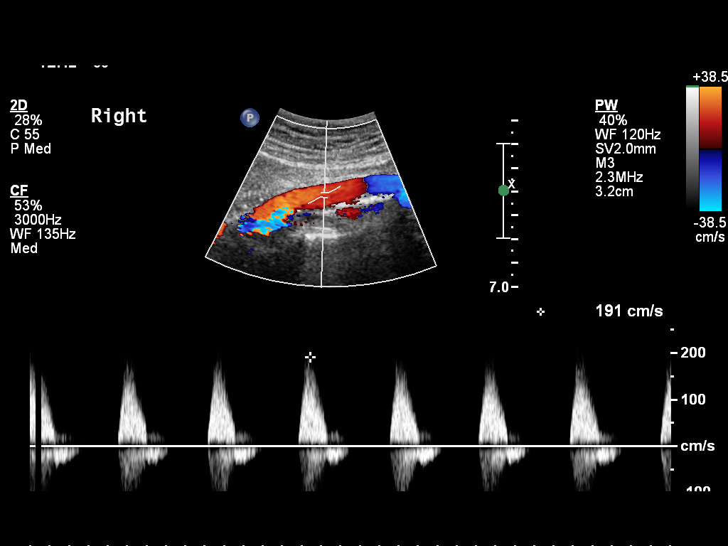
[im 21/26]
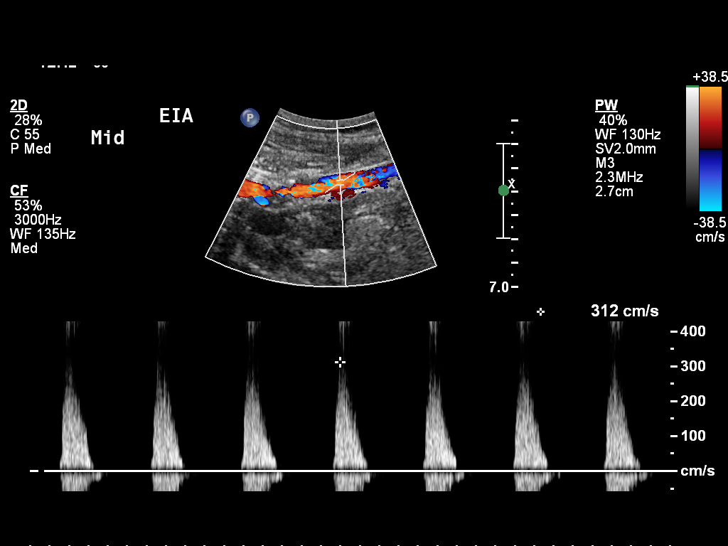
[im 23/26]
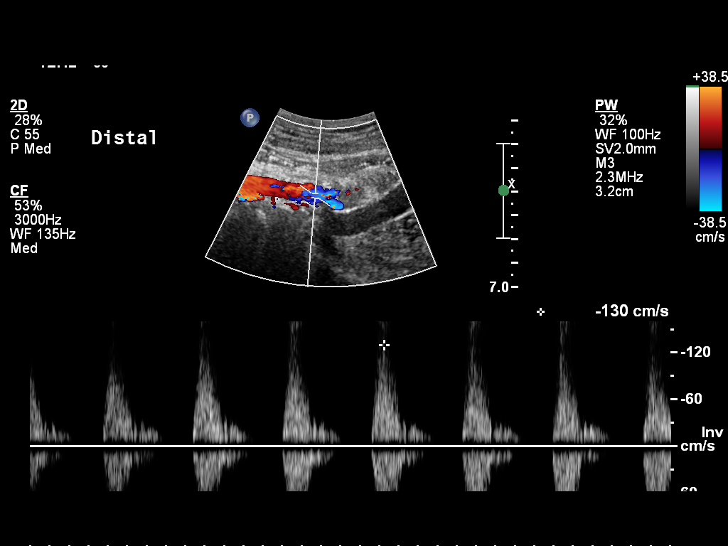
[im 26/26]
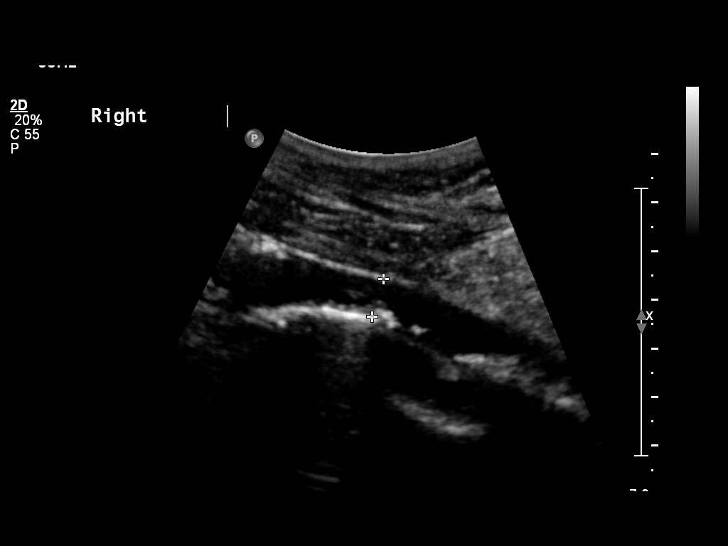

[13 of 25 positions shown; findings below may reference images not displayed]

IMPRESSION: No evidence of abdominal aortic aneurysm. Normal flow within the bilateral iliac arteries.
